# Patient Record
Sex: Female | Born: 1965 | Race: White | Hispanic: No | Marital: Married | Smoking: Never smoker
Health system: Southern US, Community
[De-identification: ages and names within clinical notes are randomized; demographics above are authoritative.]

## PROBLEM LIST (undated history)

## (undated) DIAGNOSIS — G20A1 Parkinson's disease without dyskinesia, without mention of fluctuations: Secondary | ICD-10-CM

## (undated) DIAGNOSIS — L409 Psoriasis, unspecified: Secondary | ICD-10-CM

## (undated) DIAGNOSIS — I10 Essential (primary) hypertension: Secondary | ICD-10-CM

## (undated) DIAGNOSIS — G20 Parkinson's disease: Secondary | ICD-10-CM

## (undated) HISTORY — PX: INNER EAR SURGERY: SHX679

## (undated) HISTORY — DX: Essential (primary) hypertension: I10

## (undated) HISTORY — DX: Psoriasis, unspecified: L40.9

## (undated) HISTORY — DX: Parkinson's disease without dyskinesia, without mention of fluctuations: G20.A1

## (undated) HISTORY — DX: Parkinson's disease: G20

---

## 1998-07-26 ENCOUNTER — Other Ambulatory Visit: Admission: RE | Admit: 1998-07-26 | Discharge: 1998-07-26 | Payer: Self-pay | Admitting: Obstetrics and Gynecology

## 1999-07-27 ENCOUNTER — Other Ambulatory Visit: Admission: RE | Admit: 1999-07-27 | Discharge: 1999-07-27 | Payer: Self-pay | Admitting: Obstetrics and Gynecology

## 1999-09-04 ENCOUNTER — Other Ambulatory Visit: Admission: RE | Admit: 1999-09-04 | Discharge: 1999-09-04 | Payer: Self-pay | Admitting: Obstetrics and Gynecology

## 1999-12-11 ENCOUNTER — Other Ambulatory Visit: Admission: RE | Admit: 1999-12-11 | Discharge: 1999-12-11 | Payer: Self-pay | Admitting: Obstetrics and Gynecology

## 2000-06-28 ENCOUNTER — Inpatient Hospital Stay (HOSPITAL_COMMUNITY): Admission: AD | Admit: 2000-06-28 | Discharge: 2000-07-01 | Payer: Self-pay | Admitting: Obstetrics and Gynecology

## 2000-08-08 ENCOUNTER — Other Ambulatory Visit: Admission: RE | Admit: 2000-08-08 | Discharge: 2000-08-08 | Payer: Self-pay | Admitting: Obstetrics and Gynecology

## 2001-08-25 ENCOUNTER — Other Ambulatory Visit: Admission: RE | Admit: 2001-08-25 | Discharge: 2001-08-25 | Payer: Self-pay | Admitting: Obstetrics and Gynecology

## 2002-08-21 ENCOUNTER — Other Ambulatory Visit: Admission: RE | Admit: 2002-08-21 | Discharge: 2002-08-21 | Payer: Self-pay | Admitting: Obstetrics and Gynecology

## 2003-10-11 ENCOUNTER — Other Ambulatory Visit: Admission: RE | Admit: 2003-10-11 | Discharge: 2003-10-11 | Payer: Self-pay | Admitting: Obstetrics and Gynecology

## 2004-11-10 ENCOUNTER — Other Ambulatory Visit: Admission: RE | Admit: 2004-11-10 | Discharge: 2004-11-10 | Payer: Self-pay | Admitting: Obstetrics and Gynecology

## 2005-06-07 ENCOUNTER — Ambulatory Visit: Payer: Self-pay | Admitting: General Practice

## 2005-06-22 ENCOUNTER — Ambulatory Visit: Payer: Self-pay | Admitting: General Practice

## 2005-11-29 ENCOUNTER — Other Ambulatory Visit: Admission: RE | Admit: 2005-11-29 | Discharge: 2005-11-29 | Payer: Self-pay | Admitting: Obstetrics and Gynecology

## 2006-07-02 ENCOUNTER — Ambulatory Visit: Payer: Self-pay

## 2007-07-08 ENCOUNTER — Ambulatory Visit: Payer: Self-pay

## 2008-07-08 ENCOUNTER — Ambulatory Visit: Payer: Self-pay

## 2009-07-11 ENCOUNTER — Ambulatory Visit: Payer: Self-pay

## 2010-05-30 ENCOUNTER — Ambulatory Visit: Payer: Self-pay

## 2011-05-06 ENCOUNTER — Emergency Department: Payer: Self-pay | Admitting: Emergency Medicine

## 2011-05-31 ENCOUNTER — Ambulatory Visit: Payer: Self-pay

## 2012-04-08 ENCOUNTER — Ambulatory Visit: Payer: Self-pay

## 2013-04-14 ENCOUNTER — Ambulatory Visit: Payer: Self-pay

## 2014-04-15 ENCOUNTER — Ambulatory Visit: Payer: Self-pay

## 2015-07-11 ENCOUNTER — Ambulatory Visit: Payer: Self-pay | Admitting: Physician Assistant

## 2015-07-11 DIAGNOSIS — Z299 Encounter for prophylactic measures, unspecified: Secondary | ICD-10-CM

## 2015-07-11 NOTE — Progress Notes (Signed)
Patient ID: Kathy CookeyDarlene S Thompson, female   DOB: May 29, 1966, [age] y.o.   MRN: [id number]   Patient came in to have flu shot done here in the clinic.  Patient stayed for 10 minutes and had no adverse reaction.

## 2015-08-03 ENCOUNTER — Other Ambulatory Visit: Payer: Self-pay | Admitting: Obstetrics and Gynecology

## 2015-08-03 DIAGNOSIS — Z1231 Encounter for screening mammogram for malignant neoplasm of breast: Secondary | ICD-10-CM

## 2015-08-18 ENCOUNTER — Ambulatory Visit
Admission: RE | Admit: 2015-08-18 | Discharge: 2015-08-18 | Disposition: A | Discharge status: Home / Self Care | Payer: PRIVATE HEALTH INSURANCE | Source: Ambulatory Visit | Attending: Obstetrics and Gynecology | Admitting: Obstetrics and Gynecology

## 2015-08-18 DIAGNOSIS — Z1231 Encounter for screening mammogram for malignant neoplasm of breast: Secondary | ICD-10-CM | POA: Diagnosis not present

## 2015-08-23 ENCOUNTER — Other Ambulatory Visit: Payer: Self-pay | Admitting: Obstetrics and Gynecology

## 2015-08-23 DIAGNOSIS — R928 Other abnormal and inconclusive findings on diagnostic imaging of breast: Secondary | ICD-10-CM

## 2015-08-26 ENCOUNTER — Ambulatory Visit
Admission: RE | Admit: 2015-08-26 | Discharge: 2015-08-26 | Disposition: A | Discharge status: Home / Self Care | Payer: Managed Care, Other (non HMO) | Source: Ambulatory Visit | Attending: Obstetrics and Gynecology | Admitting: Obstetrics and Gynecology

## 2015-08-26 DIAGNOSIS — R928 Other abnormal and inconclusive findings on diagnostic imaging of breast: Secondary | ICD-10-CM | POA: Diagnosis present

## 2015-09-02 ENCOUNTER — Ambulatory Visit: Payer: PRIVATE HEALTH INSURANCE

## 2015-09-02 ENCOUNTER — Other Ambulatory Visit: Payer: Self-pay

## 2015-10-20 ENCOUNTER — Other Ambulatory Visit: Payer: Self-pay

## 2015-10-20 DIAGNOSIS — Z299 Encounter for prophylactic measures, unspecified: Secondary | ICD-10-CM

## 2015-10-20 NOTE — Progress Notes (Signed)
Patient came in to have blood drawn per Dr. Roseanne Kaufman at [facility].  Blood was drawn from the right arm without any incident.

## 2015-10-21 LAB — COMPREHENSIVE METABOLIC PANEL
A/G RATIO: 1.7 (ref 1.1–2.5)
ALBUMIN: 4.5 g/dL (ref 3.5–5.5)
ALT: 15 IU/L (ref 0–32)
AST: 12 IU/L (ref 0–40)
Alkaline Phosphatase: 73 IU/L (ref 39–117)
BUN / CREAT RATIO: 18 (ref 9–23)
BUN: 12 mg/dL (ref 6–24)
Bilirubin Total: 0.4 mg/dL (ref 0.0–1.2)
CALCIUM: 9.2 mg/dL (ref 8.7–10.2)
CO2: 25 mmol/L (ref 18–29)
Chloride: 100 mmol/L (ref 96–106)
Creatinine, Ser: 0.67 mg/dL (ref 0.57–1.00)
GFR, EST AFRICAN AMERICAN: 119 mL/min/{1.73_m2} (ref >59)
GFR, EST NON AFRICAN AMERICAN: 103 mL/min/{1.73_m2} (ref >59)
GLOBULIN, TOTAL: 2.7 g/dL (ref 1.5–4.5)
Glucose: 70 mg/dL (ref 65–99)
POTASSIUM: 4.1 mmol/L (ref 3.5–5.2)
SODIUM: 138 mmol/L (ref 134–144)
TOTAL PROTEIN: 7.2 g/dL (ref 6.0–8.5)

## 2015-10-21 LAB — CBC WITH DIFFERENTIAL/PLATELET
BASOS: 1 %
Basophils Absolute: 0.1 10*3/uL (ref 0.0–0.2)
EOS (ABSOLUTE): 0.3 10*3/uL (ref 0.0–0.4)
Eos: 4 %
HEMATOCRIT: 42.3 % (ref 34.0–46.6)
Hemoglobin: 14.5 g/dL (ref 11.1–15.9)
Immature Grans (Abs): 0 10*3/uL (ref 0.0–0.1)
Immature Granulocytes: 0 %
LYMPHS ABS: 2.6 10*3/uL (ref 0.7–3.1)
Lymphs: 41 %
MCH: 31.5 pg (ref 26.6–33.0)
MCHC: 34.3 g/dL (ref 31.5–35.7)
MCV: 92 fL (ref 79–97)
MONOS ABS: 0.4 10*3/uL (ref 0.1–0.9)
Monocytes: 7 %
NEUTROS ABS: 3 10*3/uL (ref 1.4–7.0)
Neutrophils: 47 %
PLATELETS: 310 10*3/uL (ref 150–379)
RBC: 4.61 x10E6/uL (ref 3.77–5.28)
RDW: 13.8 % (ref 12.3–15.4)
WBC: 6.4 10*3/uL (ref 3.4–10.8)

## 2015-10-21 NOTE — Progress Notes (Signed)
Lab results were faxed to Dr. Park MeoIsenstien at [facility] per patient's request.

## 2015-10-28 LAB — HM COLONOSCOPY

## 2015-11-30 ENCOUNTER — Other Ambulatory Visit: Payer: Self-pay

## 2015-11-30 DIAGNOSIS — Z299 Encounter for prophylactic measures, unspecified: Secondary | ICD-10-CM

## 2015-11-30 NOTE — Progress Notes (Signed)
Patient came in to have blood drawn [facility].  Blood was drawn from the right arm without any incident.  Patient wants results sent to Dr. Roseanne KaufmanIsenstein when they are finalized.

## 2015-12-01 LAB — AST: AST: 13 IU/L (ref 0–40)

## 2015-12-01 LAB — CBC WITH DIFFERENTIAL/PLATELET
BASOS ABS: 0.1 10*3/uL (ref 0.0–0.2)
Basos: 1 %
EOS (ABSOLUTE): 0.3 10*3/uL (ref 0.0–0.4)
Eos: 4 %
HEMOGLOBIN: 13.6 g/dL (ref 11.1–15.9)
Hematocrit: 40 % (ref 34.0–46.6)
IMMATURE GRANS (ABS): 0 10*3/uL (ref 0.0–0.1)
IMMATURE GRANULOCYTES: 0 %
LYMPHS: 35 %
Lymphocytes Absolute: 2.1 10*3/uL (ref 0.7–3.1)
MCH: 31.3 pg (ref 26.6–33.0)
MCHC: 34 g/dL (ref 31.5–35.7)
MCV: 92 fL (ref 79–97)
MONOCYTES: 8 %
Monocytes Absolute: 0.5 10*3/uL (ref 0.1–0.9)
NEUTROS PCT: 52 %
Neutrophils Absolute: 3.1 10*3/uL (ref 1.4–7.0)
PLATELETS: 285 10*3/uL (ref 150–379)
RBC: 4.35 x10E6/uL (ref 3.77–5.28)
RDW: 13.9 % (ref 12.3–15.4)
WBC: 5.9 10*3/uL (ref 3.4–10.8)

## 2015-12-01 LAB — ALT: ALT: 7 IU/L (ref 0–32)

## 2016-04-20 ENCOUNTER — Other Ambulatory Visit: Payer: Self-pay

## 2016-04-20 DIAGNOSIS — Z299 Encounter for prophylactic measures, unspecified: Secondary | ICD-10-CM

## 2016-04-20 NOTE — Progress Notes (Signed)
Patient came in to have blood drawn per Dr. Durene Calasher's orders.

## 2016-04-21 LAB — HEPATITIS B CORE ANTIBODY, TOTAL: HEP B C TOTAL AB: NEGATIVE

## 2016-04-21 LAB — HEPATITIS B SURFACE ANTIGEN: Hepatitis B Surface Ag: NEGATIVE

## 2016-04-21 LAB — HEPATITIS B SURFACE ANTIBODY, QUANTITATIVE: Hepatitis B Surf Ab Quant: <3.1 m[IU]/mL — ABNORMAL LOW (ref >9.9)

## 2016-04-21 LAB — HEPATITIS C ANTIBODY: Hep C Virus Ab: 0.3 s/co ratio (ref 0.0–0.9)

## 2016-04-25 LAB — QUANTIFERON IN TUBE
QUANTIFERON MITOGEN VALUE: >10 IU/mL
QUANTIFERON TB AG VALUE: 0.01 [IU]/mL
QUANTIFERON TB GOLD: NEGATIVE
Quantiferon Nil Value: 0.02 IU/mL

## 2016-04-25 LAB — QUANTIFERON TB GOLD ASSAY (BLOOD)

## 2016-05-24 ENCOUNTER — Other Ambulatory Visit: Payer: Self-pay

## 2016-05-24 DIAGNOSIS — Z299 Encounter for prophylactic measures, unspecified: Secondary | ICD-10-CM

## 2016-05-24 NOTE — Progress Notes (Signed)
Patient came in to have blood drawn for testing per Dr. Octavio Gravesavid Lowe's orders.

## 2016-05-25 LAB — FOLLICLE STIMULATING HORMONE: FSH: 26 m[IU]/mL

## 2016-11-27 ENCOUNTER — Other Ambulatory Visit: Payer: Self-pay

## 2016-11-27 NOTE — Progress Notes (Signed)
Patient came in to have her biometric screening done.

## 2016-11-28 ENCOUNTER — Other Ambulatory Visit: Payer: Self-pay

## 2016-11-28 DIAGNOSIS — Z299 Encounter for prophylactic measures, unspecified: Secondary | ICD-10-CM

## 2016-11-28 NOTE — Progress Notes (Signed)
Patient came in to have blood drawn for testing per Susan's authorization. 

## 2016-11-29 LAB — CMP12+LP+TP+TSH+6AC+CBC/D/PLT
A/G RATIO: 2 (ref 1.2–2.2)
ALBUMIN: 4.5 g/dL (ref 3.5–5.5)
ALK PHOS: 59 IU/L (ref 39–117)
ALT: 9 IU/L (ref 0–32)
AST: 14 IU/L (ref 0–40)
BASOS: 0 %
BILIRUBIN TOTAL: 0.4 mg/dL (ref 0.0–1.2)
BUN / CREAT RATIO: 18 (ref 9–23)
BUN: 12 mg/dL (ref 6–24)
Basophils Absolute: 0 10*3/uL (ref 0.0–0.2)
CALCIUM: 9 mg/dL (ref 8.7–10.2)
CHOL/HDL RATIO: 3.1 ratio (ref 0.0–4.4)
Chloride: 100 mmol/L (ref 96–106)
Cholesterol, Total: 182 mg/dL (ref 100–199)
Creatinine, Ser: 0.65 mg/dL (ref 0.57–1.00)
EOS (ABSOLUTE): 0.2 10*3/uL (ref 0.0–0.4)
EOS: 3 %
Estimated CHD Risk: <0.5 times avg. (ref 0.0–1.0)
FREE THYROXINE INDEX: 2.1 (ref 1.2–4.9)
GFR calc non Af Amer: 103 mL/min/{1.73_m2} (ref >59)
GFR, EST AFRICAN AMERICAN: 119 mL/min/{1.73_m2} (ref >59)
GGT: 10 IU/L (ref 0–60)
GLOBULIN, TOTAL: 2.3 g/dL (ref 1.5–4.5)
Glucose: 85 mg/dL (ref 65–99)
HDL: 59 mg/dL (ref >39)
HEMATOCRIT: 44.8 % (ref 34.0–46.6)
Hemoglobin: 15 g/dL (ref 11.1–15.9)
IMMATURE GRANS (ABS): 0 10*3/uL (ref 0.0–0.1)
Immature Granulocytes: 0 %
Iron: 128 ug/dL (ref 27–159)
LDH: 165 IU/L (ref 119–226)
LDL CALC: 111 mg/dL — AB (ref 0–99)
LYMPHS: 24 %
Lymphocytes Absolute: 1.9 10*3/uL (ref 0.7–3.1)
MCH: 31.5 pg (ref 26.6–33.0)
MCHC: 33.5 g/dL (ref 31.5–35.7)
MCV: 94 fL (ref 79–97)
MONOCYTES: 8 %
MONOS ABS: 0.6 10*3/uL (ref 0.1–0.9)
NEUTROS ABS: 5.3 10*3/uL (ref 1.4–7.0)
NEUTROS PCT: 65 %
POTASSIUM: 4.5 mmol/L (ref 3.5–5.2)
Phosphorus: 3.2 mg/dL (ref 2.5–4.5)
Platelets: 279 10*3/uL (ref 150–379)
RBC: 4.76 x10E6/uL (ref 3.77–5.28)
RDW: 13.2 % (ref 12.3–15.4)
SODIUM: 139 mmol/L (ref 134–144)
T3 Uptake Ratio: 24 % (ref 24–39)
T4, Total: 8.8 ug/dL (ref 4.5–12.0)
TRIGLYCERIDES: 60 mg/dL (ref 0–149)
TSH: 1.31 u[IU]/mL (ref 0.450–4.500)
Total Protein: 6.8 g/dL (ref 6.0–8.5)
Uric Acid: 3.2 mg/dL (ref 2.5–7.1)
VLDL CHOLESTEROL CAL: 12 mg/dL (ref 5–40)
WBC: 8.2 10*3/uL (ref 3.4–10.8)

## 2016-11-29 LAB — HEPATITIS C ANTIBODY (REFLEX): HCV AB: 0.5 {s_co_ratio} (ref 0.0–0.9)

## 2016-11-29 LAB — VITAMIN D 25 HYDROXY (VIT D DEFICIENCY, FRACTURES): VIT D 25 HYDROXY: 19.2 ng/mL — AB (ref 30.0–100.0)

## 2016-11-29 LAB — HCV COMMENT:

## 2016-11-29 LAB — HIV ANTIBODY (ROUTINE TESTING W REFLEX): HIV Screen 4th Generation wRfx: NONREACTIVE

## 2017-04-05 IMAGING — MG MM DIGITAL SCREENING BILATERAL
4 series · 4 of 4 positions shown · non-contrast
Comparison: Previous exam(s).

CLINICAL DATA: Screening.

EXAM:
DIGITAL SCREENING BILATERAL MAMMOGRAM WITH CAD

[R MLO]
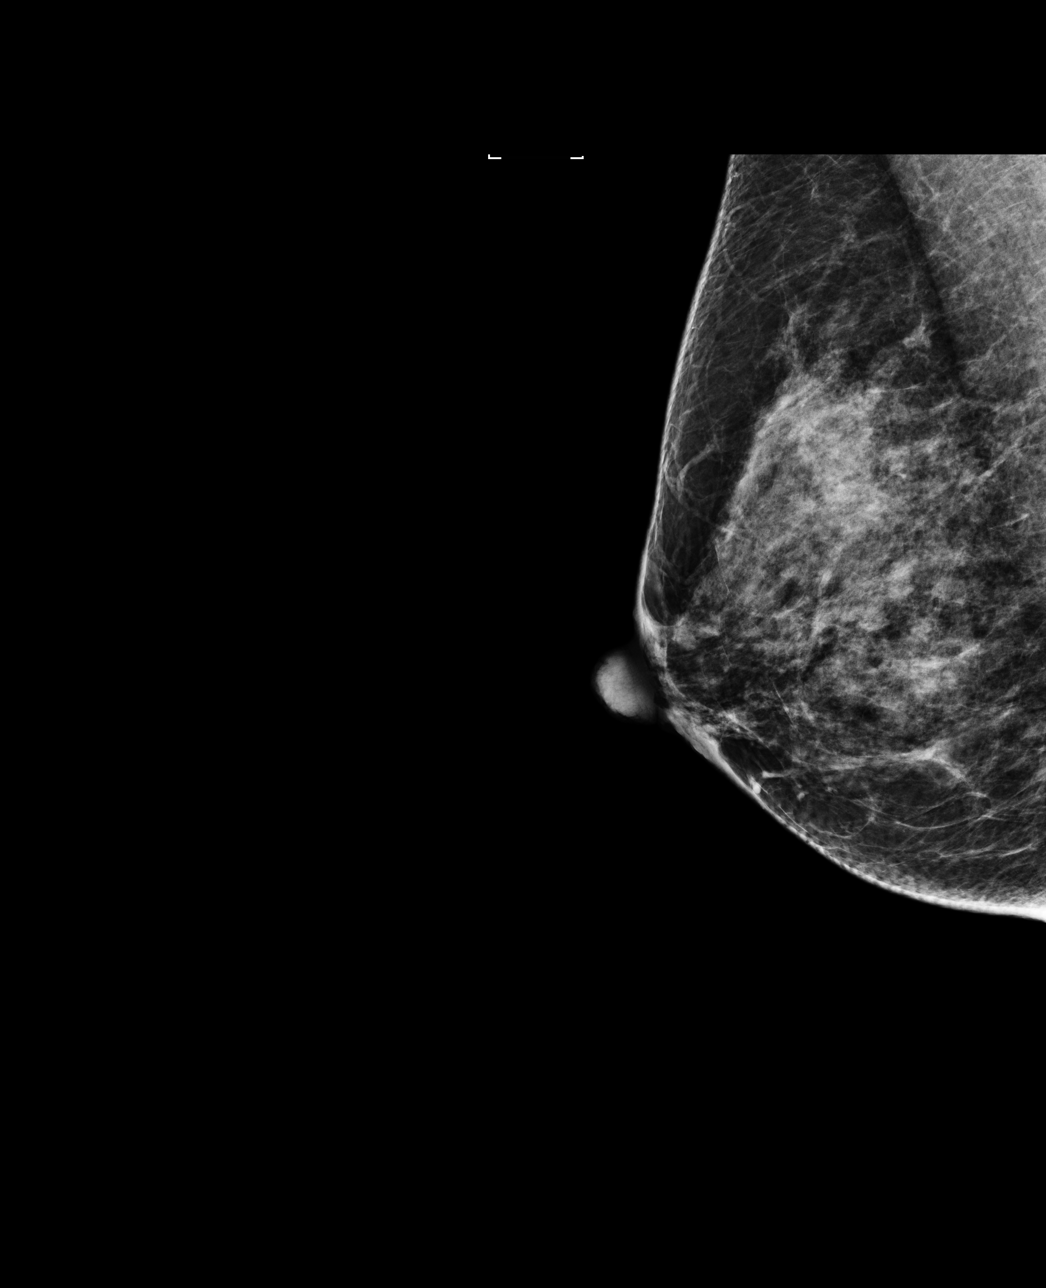

[L MLO]
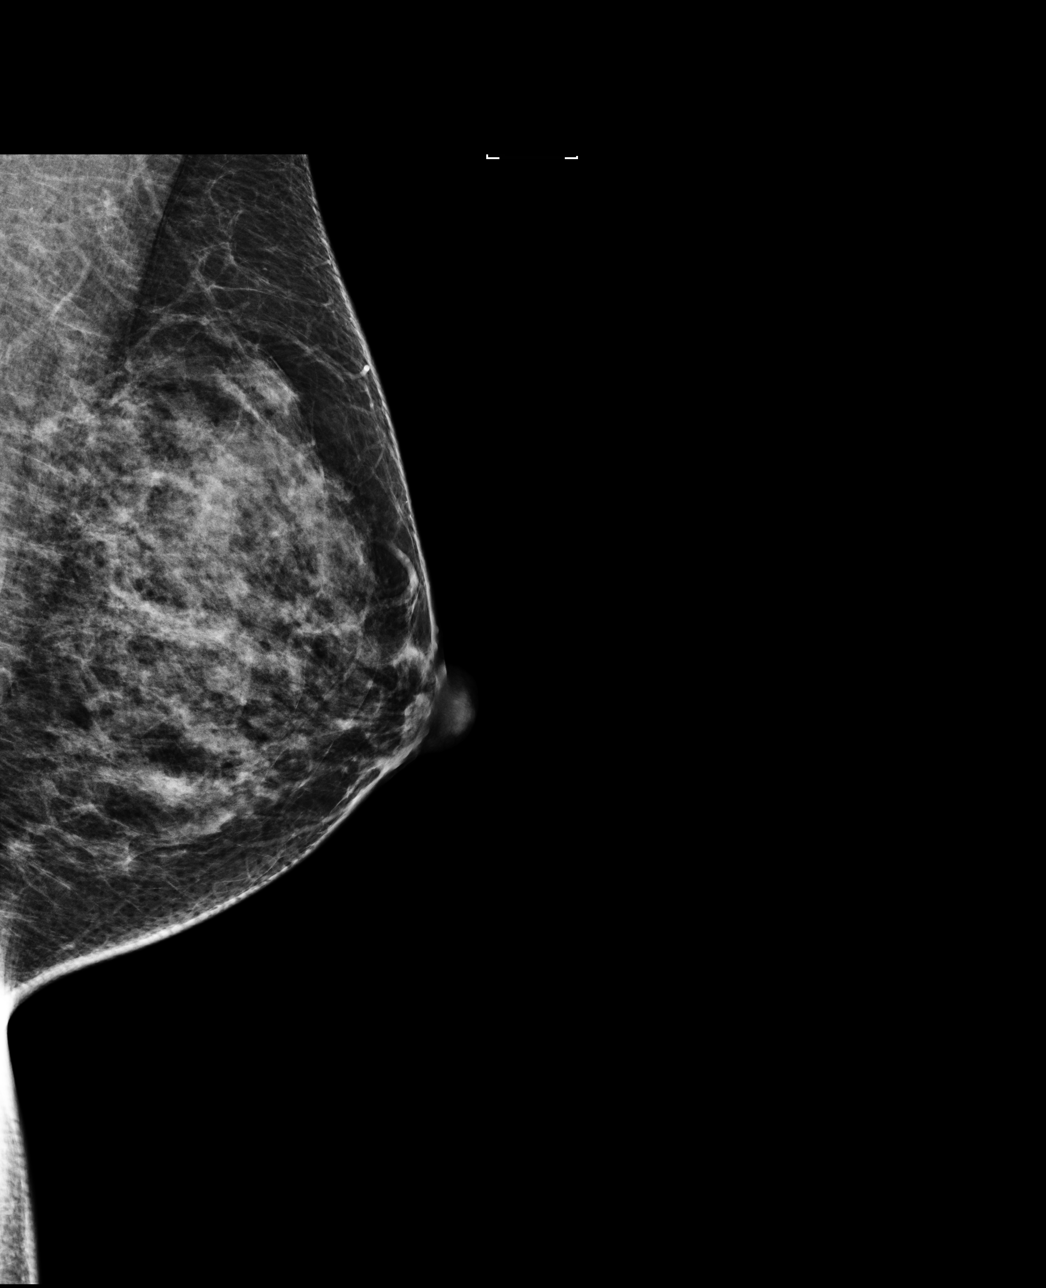

[R CC]
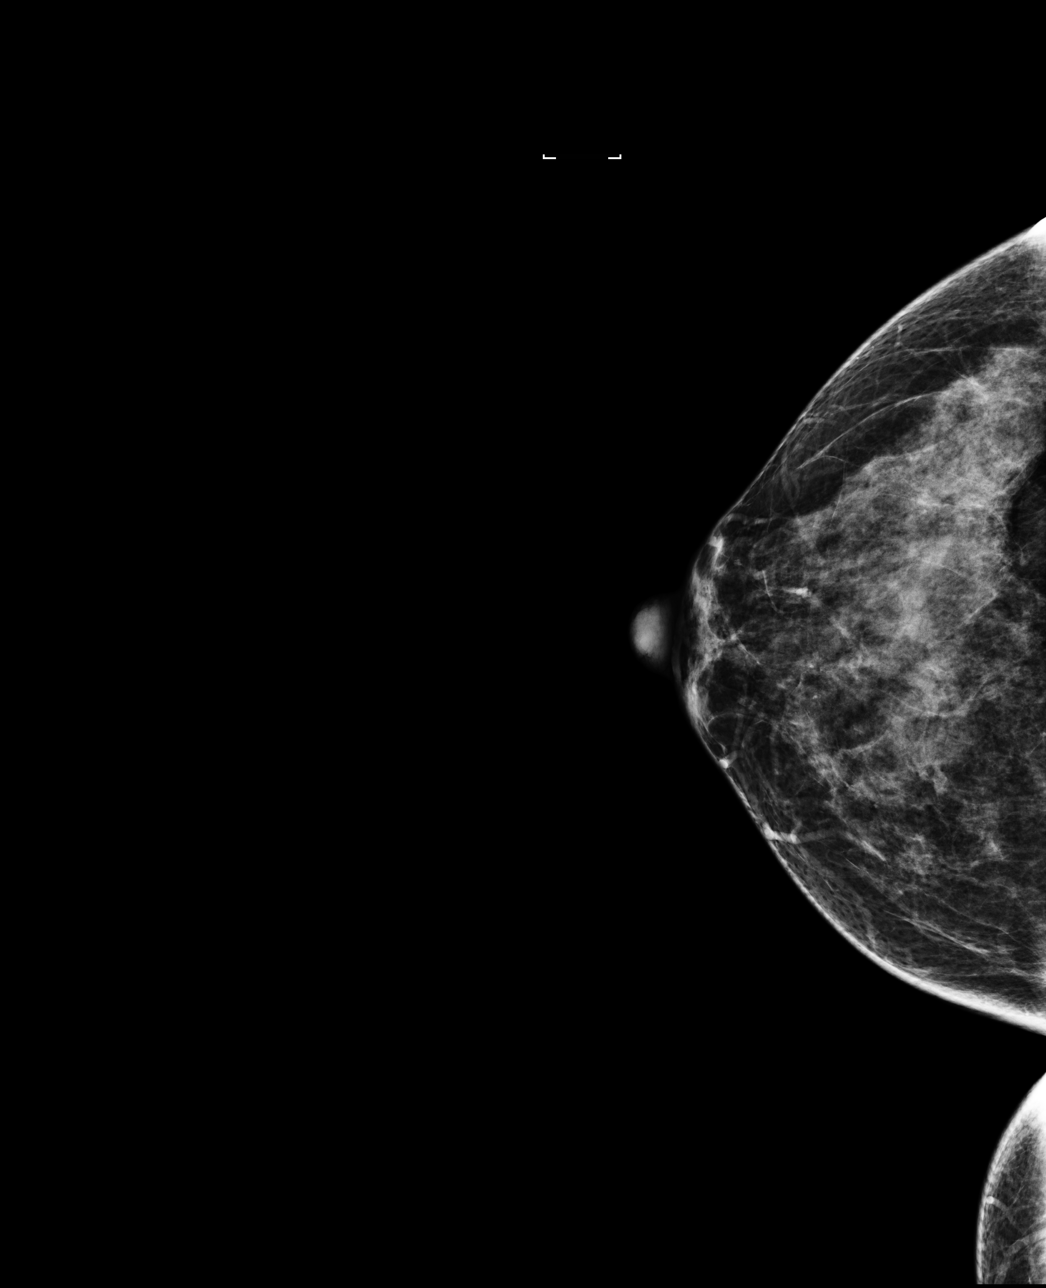

[L CC]
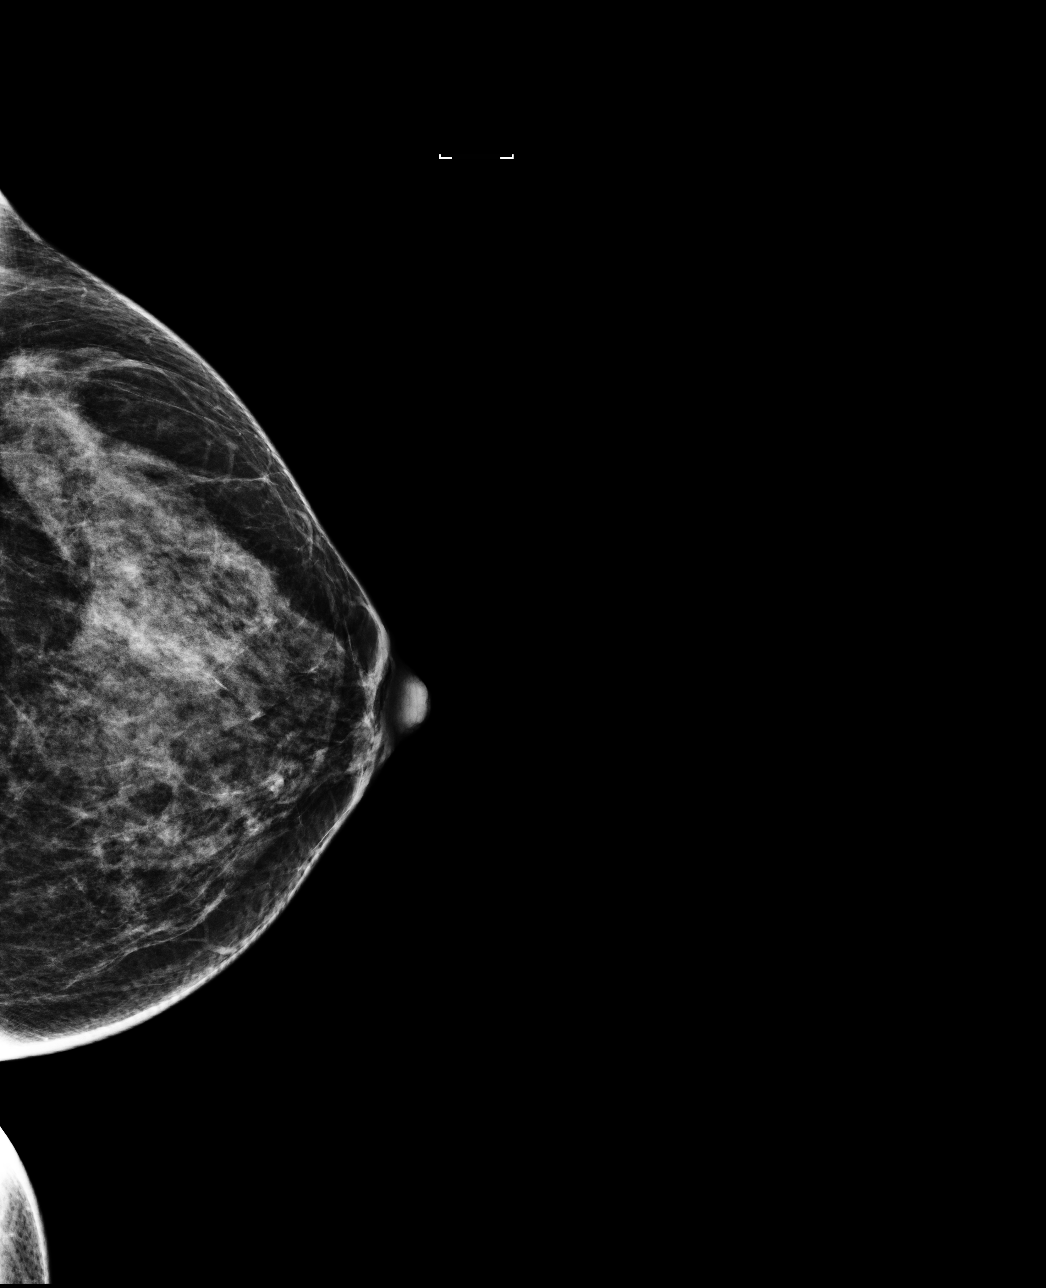

[4 of 4 positions shown; findings below may reference images not displayed]

ACR Breast Density Category c: The breast tissue is heterogeneously
dense, which may obscure small masses.
FINDINGS: In the right breast, a possible mass warrants further evaluation. In
the left breast, no findings suspicious for malignancy. Images were
processed with CAD.
IMPRESSION: Further evaluation is suggested for possible mass in the right
breast.

RECOMMENDATION:
Diagnostic mammogram and possibly ultrasound of the right breast.
(Code:[id number])

The patient will be contacted regarding the findings, and additional
imaging will be scheduled.

BI-RADS CATEGORY  0: Incomplete. Need additional imaging evaluation
and/or prior mammograms for comparison.

## 2017-09-04 ENCOUNTER — Ambulatory Visit: Payer: Self-pay

## 2017-09-06 ENCOUNTER — Other Ambulatory Visit: Payer: Self-pay | Admitting: Medical

## 2017-09-06 DIAGNOSIS — Z008 Encounter for other general examination: Secondary | ICD-10-CM

## 2017-09-06 DIAGNOSIS — Z0189 Encounter for other specified special examinations: Principal | ICD-10-CM

## 2017-09-06 NOTE — Progress Notes (Signed)
Patient came in to have blood drawn for testing per Dr. Roseanne KaufmanIsenstein and Dr. Vance GatherLowe's orders.  She also had her biometric wellness screening.

## 2017-09-07 LAB — CMP12+LP+TP+TSH+6AC+CBC/D/PLT
ALBUMIN: 4.3 g/dL (ref 3.5–5.5)
ALK PHOS: 58 IU/L (ref 39–117)
ALT: 10 IU/L (ref 0–32)
AST: 14 IU/L (ref 0–40)
Albumin/Globulin Ratio: 1.7 (ref 1.2–2.2)
BILIRUBIN TOTAL: 0.4 mg/dL (ref 0.0–1.2)
BUN/Creatinine Ratio: 16 (ref 9–23)
BUN: 11 mg/dL (ref 6–24)
Basophils Absolute: 0.1 10*3/uL (ref 0.0–0.2)
Basos: 1 %
CHLORIDE: 103 mmol/L (ref 96–106)
CHOLESTEROL TOTAL: 177 mg/dL (ref 100–199)
Calcium: 9.1 mg/dL (ref 8.7–10.2)
Chol/HDL Ratio: 2.9 ratio (ref 0.0–4.4)
Creatinine, Ser: 0.67 mg/dL (ref 0.57–1.00)
EOS (ABSOLUTE): 0.2 10*3/uL (ref 0.0–0.4)
Eos: 3 %
FREE THYROXINE INDEX: 1.9 (ref 1.2–4.9)
GFR calc Af Amer: 117 mL/min/{1.73_m2} (ref >59)
GFR calc non Af Amer: 101 mL/min/{1.73_m2} (ref >59)
GGT: 9 IU/L (ref 0–60)
GLOBULIN, TOTAL: 2.6 g/dL (ref 1.5–4.5)
Glucose: 79 mg/dL (ref 65–99)
HDL: 62 mg/dL (ref >39)
HEMOGLOBIN: 14.9 g/dL (ref 11.1–15.9)
Hematocrit: 43.6 % (ref 34.0–46.6)
IMMATURE GRANULOCYTES: 0 %
Immature Grans (Abs): 0 10*3/uL (ref 0.0–0.1)
Iron: 107 ug/dL (ref 27–159)
LDH: 162 IU/L (ref 119–226)
LDL Calculated: 105 mg/dL — ABNORMAL HIGH (ref 0–99)
LYMPHS ABS: 2.6 10*3/uL (ref 0.7–3.1)
Lymphs: 32 %
MCH: 32.3 pg (ref 26.6–33.0)
MCHC: 34.2 g/dL (ref 31.5–35.7)
MCV: 95 fL (ref 79–97)
MONOS ABS: 0.5 10*3/uL (ref 0.1–0.9)
Monocytes: 5 %
NEUTROS PCT: 59 %
Neutrophils Absolute: 4.9 10*3/uL (ref 1.4–7.0)
Phosphorus: 3.3 mg/dL (ref 2.5–4.5)
Platelets: 266 10*3/uL (ref 150–379)
Potassium: 4.3 mmol/L (ref 3.5–5.2)
RBC: 4.61 x10E6/uL (ref 3.77–5.28)
RDW: 13.4 % (ref 12.3–15.4)
Sodium: 139 mmol/L (ref 134–144)
T3 Uptake Ratio: 23 % — ABNORMAL LOW (ref 24–39)
T4, Total: 8.3 ug/dL (ref 4.5–12.0)
TOTAL PROTEIN: 6.9 g/dL (ref 6.0–8.5)
TRIGLYCERIDES: 51 mg/dL (ref 0–149)
TSH: 1.28 u[IU]/mL (ref 0.450–4.500)
Uric Acid: 3.1 mg/dL (ref 2.5–7.1)
VLDL CHOLESTEROL CAL: 10 mg/dL (ref 5–40)
WBC: 8.3 10*3/uL (ref 3.4–10.8)

## 2017-09-07 LAB — VITAMIN D 25 HYDROXY (VIT D DEFICIENCY, FRACTURES): Vit D, 25-Hydroxy: 17.9 ng/mL — ABNORMAL LOW (ref 30.0–100.0)

## 2017-09-07 LAB — FOLLICLE STIMULATING HORMONE: FSH: 4.4 m[IU]/mL

## 2017-09-10 LAB — QUANTIFERON-TB GOLD PLUS
QUANTIFERON NIL VALUE: 0.04 [IU]/mL
QUANTIFERON TB2 AG VALUE: 0.04 [IU]/mL
QUANTIFERON-TB GOLD PLUS: NEGATIVE
QuantiFERON TB1 Ag Value: 0.03 IU/mL

## 2018-08-24 ENCOUNTER — Encounter: Payer: Self-pay | Admitting: Nurse Practitioner

## 2018-08-24 DIAGNOSIS — Z87898 Personal history of other specified conditions: Secondary | ICD-10-CM | POA: Insufficient documentation

## 2018-08-25 ENCOUNTER — Encounter: Payer: Self-pay | Admitting: Nurse Practitioner

## 2018-08-25 ENCOUNTER — Ambulatory Visit (INDEPENDENT_AMBULATORY_CARE_PROVIDER_SITE_OTHER): Payer: Managed Care, Other (non HMO) | Admitting: Nurse Practitioner

## 2018-08-25 ENCOUNTER — Other Ambulatory Visit: Payer: Self-pay

## 2018-08-25 VITALS — BP 138/90 | HR 87 | Temp 98.5°F | Ht 66.0 in | Wt 151.0 lb

## 2018-08-25 DIAGNOSIS — R030 Elevated blood-pressure reading, without diagnosis of hypertension: Secondary | ICD-10-CM | POA: Diagnosis not present

## 2018-08-25 DIAGNOSIS — L409 Psoriasis, unspecified: Secondary | ICD-10-CM

## 2018-08-25 DIAGNOSIS — I10 Essential (primary) hypertension: Secondary | ICD-10-CM | POA: Insufficient documentation

## 2018-08-25 NOTE — Assessment & Plan Note (Addendum)
Continue to collaborate with Dr. Isa Rankin.  Continue Humira.

## 2018-08-25 NOTE — Progress Notes (Signed)
New Patient Office Visit  Subjective:  Patient ID: Kathy Thompson, female    DOB: [birth year]  Age: 53 y.o. MRN: [id number]  CC:  Chief Complaint  Patient presents with  . Establish Care    pt would like to discuss her BP    HPI Kathy CookeyDarlene S Thompson presents for new patient visit to establish care.  Introduced to Publishing rights managernurse practitioner role and practice setting.  All questions answered.  On review of overall history in chart, patient has been relatively healthy with exception of x 1 abnormal mammogram in 2016 with normal repeat screening (initially noted possible mass in right breast).  She is due for her next mammogram in March, Dr. Rana SnareLowe orders this for her.    ELEVATED BP: She recently saw Dr. Candice Campavid Lowe for her annual pap smear, at appointment her BP was elevated (on initial and recheck).  She states the doctor told her he was not sure if it was related to long term BCP use, currently continues on this but states the doctor is "supposed to send in lower dose medication for me".  She believes her sister is on BP medication.  Has been doing BP at home 140's/90's.   Has not been exercising as much since the gym across the street from her workplace closed, encouraged regular exercise regimen.  States she drinks a few diet mountain dew a day, had at length discussion about diet regimen with patient.    PSORIASIS: Is followed by Dr. Isa RankinEisenstein and continues on Humira.  She visits with provider twice a year, every six months.  Denies any issues with current medication regimen.  History reviewed. No pertinent past medical history.  Past Surgical History:  Procedure Laterality Date  . CESAREAN SECTION    . INNER EAR SURGERY      Family History  Problem Relation Age of Onset  . Breast cancer Cousin        2 first maternal cousins, 9045 and 950's  . Cancer Father   . Hypertension Sister     Social History   Socioeconomic History  . Marital status: Married    Spouse name: Not on file  .  Number of children: Not on file  . Years of education: Not on file  . Highest education level: Not on file  Occupational History  . Occupation: [employer]    Comment: 7 years  Social Needs  . Financial resource strain: Not hard at all  . Food insecurity:    Worry: Never true    Inability: Never true  . Transportation needs:    Medical: No    Non-medical: No  Tobacco Use  . Smoking status: Never Smoker  . Smokeless tobacco: Never Used  Substance and Sexual Activity  . Alcohol use: Never    Frequency: Never  . Drug use: Never  . Sexual activity: Yes  Lifestyle  . Physical activity:    Days per week: 0 days    Minutes per session: 0 min  . Stress: Only a little  Relationships  . Social connections:    Talks on phone: More than three times a week    Gets together: More than three times a week    Attends religious service: More than 4 times per year    Active member of club or organization: No    Attends meetings of clubs or organizations: Never    Relationship status: Married  . Intimate partner violence:    Fear of current  or ex partner: No    Emotionally abused: No    Physically abused: No    Forced sexual activity: No  Other Topics Concern  . Not on file  Social History Narrative  . Not on file    ROS Review of Systems  Constitutional: Negative for activity change, appetite change, diaphoresis, fatigue and fever.  Respiratory: Negative for cough, chest tightness, shortness of breath and wheezing.   Cardiovascular: Negative for chest pain, palpitations and leg swelling.  Gastrointestinal: Negative for abdominal distention, abdominal pain, constipation, diarrhea, nausea and vomiting.  Endocrine: Negative for cold intolerance, heat intolerance, polydipsia, polyphagia and polyuria.  Musculoskeletal: Negative.   Neurological: Negative for dizziness, syncope, weakness, light-headedness, numbness and headaches.  Psychiatric/Behavioral: Negative.      Objective:   Today's Vitals: BP 138/90 (BP Location: Left Arm)   Pulse 87   Temp 98.5 F (36.9 C) (Oral)   Ht 5\' 6"  (1.676 m)   Wt 151 lb (68.5 kg)   SpO2 100%   BMI 24.37 kg/m   Physical Exam Vitals signs and nursing note reviewed.  Constitutional:      General: She is awake.     Appearance: She is well-developed.  HENT:     Head: Normocephalic.     Right Ear: Hearing normal.     Left Ear: Hearing normal.     Nose: Nose normal.     Mouth/Throat:     Mouth: Mucous membranes are moist.  Eyes:     General: Lids are normal.        Right eye: No discharge.        Left eye: No discharge.     Conjunctiva/sclera: Conjunctivae normal.     Pupils: Pupils are equal, round, and reactive to light.  Neck:     Musculoskeletal: Normal range of motion and neck supple.     Thyroid: No thyromegaly.     Vascular: No carotid bruit or JVD.  Cardiovascular:     Rate and Rhythm: Normal rate and regular rhythm.     Heart sounds: Normal heart sounds.  Pulmonary:     Effort: Pulmonary effort is normal.     Breath sounds: Normal breath sounds.  Abdominal:     General: Bowel sounds are normal.     Palpations: Abdomen is soft. There is no hepatomegaly or splenomegaly.  Lymphadenopathy:     Cervical: No cervical adenopathy.  Skin:    General: Skin is warm and dry.  Neurological:     Mental Status: She is alert and oriented to person, place, and time.  Psychiatric:        Attention and Perception: Attention normal.        Mood and Affect: Mood normal.        Behavior: Behavior normal. Behavior is cooperative.        Thought Content: Thought content normal.        Judgment: Judgment normal.     Assessment & Plan:   Problem List Items Addressed This Visit      Musculoskeletal and Integument   Psoriasis    Continue to collaborate with Dr. Isa Rankin.  Continue Humira.        Other   Elevated BP without diagnosis of hypertension    Recommended checking BP at home 3 times a  week and document.  Recommended DASH diet and regular exercise over the next 4 weeks.  If continued elevations at next visit will initiate medication at low dose. Labs next visit.  Outpatient Encounter Medications as of 08/25/2018  Medication Sig  . Adalimumab (HUMIRA [place]) Inject into the skin. Every other week  . norgestrel-ethinyl estradiol (LO/OVRAL,CRYSELLE) 0.3-30 MG-MCG tablet Take 1 tablet by mouth daily.   No facility-administered encounter medications on file as of 08/25/2018.     Follow-up: Return in about 4 weeks (around 09/22/2018) for Follow-up.   Marjie SkiffJOLENE T Quadasia Newsham, NP

## 2018-08-25 NOTE — Assessment & Plan Note (Signed)
Recommended checking BP at home 3 times a week and document.  Recommended DASH diet and regular exercise over the next 4 weeks.  If continued elevations at next visit will initiate medication at low dose. Labs next visit.

## 2018-08-25 NOTE — Patient Instructions (Signed)
DASH Eating Plan  DASH stands for "Dietary Approaches to Stop Hypertension." The DASH eating plan is a healthy eating plan that has been shown to reduce high blood pressure (hypertension). It may also reduce your risk for type 2 diabetes, heart disease, and stroke. The DASH eating plan may also help with weight loss.  What are tips for following this plan?    General guidelines   Avoid eating more than 2,300 mg (milligrams) of salt (sodium) a day. If you have hypertension, you may need to reduce your sodium intake to 1,500 mg a day.   Limit alcohol intake to no more than 1 drink a day for nonpregnant women and 2 drinks a day for men. One drink equals 12 oz of beer, 5 oz of wine, or 1 oz of hard liquor.   Work with your health care provider to maintain a healthy body weight or to lose weight. Ask what an ideal weight is for you.   Get at least 30 minutes of exercise that causes your heart to beat faster (aerobic exercise) most days of the week. Activities may include walking, swimming, or biking.   Work with your health care provider or diet and nutrition specialist (dietitian) to adjust your eating plan to your individual calorie needs.  Reading food labels     Check food labels for the amount of sodium per serving. Choose foods with less than 5 percent of the Daily Value of sodium. Generally, foods with less than 300 mg of sodium per serving fit into this eating plan.   To find whole grains, look for the word "whole" as the first word in the ingredient list.  Shopping   Buy products labeled as "low-sodium" or "no salt added."   Buy fresh foods. Avoid canned foods and premade or frozen meals.  Cooking   Avoid adding salt when cooking. Use salt-free seasonings or herbs instead of table salt or sea salt. Check with your health care provider or pharmacist before using salt substitutes.   Do not fry foods. Cook foods using healthy methods such as baking, boiling, grilling, and broiling instead.   Cook with  heart-healthy oils, such as olive, canola, soybean, or sunflower oil.  Meal planning   Eat a balanced diet that includes:  ? 5 or more servings of fruits and vegetables each day. At each meal, try to fill half of your plate with fruits and vegetables.  ? Up to 6-8 servings of whole grains each day.  ? Less than 6 oz of lean meat, poultry, or fish each day. A 3-oz serving of meat is about the same size as a deck of cards. One egg equals 1 oz.  ? 2 servings of low-fat dairy each day.  ? A serving of nuts, seeds, or beans 5 times each week.  ? Heart-healthy fats. Healthy fats called Omega-3 fatty acids are found in foods such as flaxseeds and coldwater fish, like sardines, salmon, and mackerel.   Limit how much you eat of the following:  ? Canned or prepackaged foods.  ? Food that is high in trans fat, such as fried foods.  ? Food that is high in saturated fat, such as fatty meat.  ? Sweets, desserts, sugary drinks, and other foods with added sugar.  ? Full-fat dairy products.   Do not salt foods before eating.   Try to eat at least 2 vegetarian meals each week.   Eat more home-cooked food and less restaurant, buffet, and fast food.     When eating at a restaurant, ask that your food be prepared with less salt or no salt, if possible.  What foods are recommended?  The items listed may not be a complete list. Talk with your dietitian about what dietary choices are best for you.  Grains  Whole-grain or whole-wheat bread. Whole-grain or whole-wheat pasta. Brown rice. Oatmeal. Quinoa. Bulgur. Whole-grain and low-sodium cereals. Pita bread. Low-fat, low-sodium crackers. Whole-wheat flour tortillas.  Vegetables  Fresh or frozen vegetables (raw, steamed, roasted, or grilled). Low-sodium or reduced-sodium tomato and vegetable juice. Low-sodium or reduced-sodium tomato sauce and tomato paste. Low-sodium or reduced-sodium canned vegetables.  Fruits  All fresh, dried, or frozen fruit. Canned fruit in natural juice (without  added sugar).  Meat and other protein foods  Skinless chicken or turkey. Ground chicken or turkey. Pork with fat trimmed off. Fish and seafood. Egg whites. Dried beans, peas, or lentils. Unsalted nuts, nut butters, and seeds. Unsalted canned beans. Lean cuts of beef with fat trimmed off. Low-sodium, lean deli meat.  Dairy  Low-fat (1%) or fat-free (skim) milk. Fat-free, low-fat, or reduced-fat cheeses. Nonfat, low-sodium ricotta or cottage cheese. Low-fat or nonfat yogurt. Low-fat, low-sodium cheese.  Fats and oils  Soft margarine without trans fats. Vegetable oil. Low-fat, reduced-fat, or light mayonnaise and salad dressings (reduced-sodium). Canola, safflower, olive, soybean, and sunflower oils. Avocado.  Seasoning and other foods  Herbs. Spices. Seasoning mixes without salt. Unsalted popcorn and pretzels. Fat-free sweets.  What foods are not recommended?  The items listed may not be a complete list. Talk with your dietitian about what dietary choices are best for you.  Grains  Baked goods made with fat, such as croissants, muffins, or some breads. Dry pasta or rice meal packs.  Vegetables  Creamed or fried vegetables. Vegetables in a cheese sauce. Regular canned vegetables (not low-sodium or reduced-sodium). Regular canned tomato sauce and paste (not low-sodium or reduced-sodium). Regular tomato and vegetable juice (not low-sodium or reduced-sodium). Pickles. Olives.  Fruits  Canned fruit in a light or heavy syrup. Fried fruit. Fruit in cream or butter sauce.  Meat and other protein foods  Fatty cuts of meat. Ribs. Fried meat. Bacon. Sausage. Bologna and other processed lunch meats. Salami. Fatback. Hotdogs. Bratwurst. Salted nuts and seeds. Canned beans with added salt. Canned or smoked fish. Whole eggs or egg yolks. Chicken or turkey with skin.  Dairy  Whole or 2% milk, cream, and half-and-half. Whole or full-fat cream cheese. Whole-fat or sweetened yogurt. Full-fat cheese. Nondairy creamers. Whipped toppings.  Processed cheese and cheese spreads.  Fats and oils  Butter. Stick margarine. Lard. Shortening. Ghee. Bacon fat. Tropical oils, such as coconut, palm kernel, or palm oil.  Seasoning and other foods  Salted popcorn and pretzels. Onion salt, garlic salt, seasoned salt, table salt, and sea salt. Worcestershire sauce. Tartar sauce. Barbecue sauce. Teriyaki sauce. Soy sauce, including reduced-sodium. Steak sauce. Canned and packaged gravies. Fish sauce. Oyster sauce. Cocktail sauce. Horseradish that you find on the shelf. Ketchup. Mustard. Meat flavorings and tenderizers. Bouillon cubes. Hot sauce and Tabasco sauce. Premade or packaged marinades. Premade or packaged taco seasonings. Relishes. Regular salad dressings.  Where to find more information:   National Heart, Lung, and Blood Institute: www.nhlbi.nih.gov   American Heart Association: www.heart.org  Summary   The DASH eating plan is a healthy eating plan that has been shown to reduce high blood pressure (hypertension). It may also reduce your risk for type 2 diabetes, heart disease, and stroke.   With the   DASH eating plan, you should limit salt (sodium) intake to 2,300 mg a day. If you have hypertension, you may need to reduce your sodium intake to 1,500 mg a day.   When on the DASH eating plan, aim to eat more fresh fruits and vegetables, whole grains, lean proteins, low-fat dairy, and heart-healthy fats.   Work with your health care provider or diet and nutrition specialist (dietitian) to adjust your eating plan to your individual calorie needs.  This information is not intended to replace advice given to you by your health care provider. Make sure you discuss any questions you have with your health care provider.  Document Released: 07/26/2011 Document Revised: 07/30/2016 Document Reviewed: 07/30/2016  Elsevier Interactive Patient Education  2019 [employer]

## 2018-09-02 ENCOUNTER — Ambulatory Visit: Payer: Self-pay | Admitting: *Deleted

## 2018-09-02 NOTE — Telephone Encounter (Signed)
Pt reports seen by J. Cannady 08/25/2018. Has been monitoring  BP per instructions. Reports since last week BP has been ranging 140's-150's/ low 90's, "At times higher 90's to 101." States yesterday 160/101, this AM 154/98. Pt reports intermittent headache yesterday; no symptoms today. Appt made with Criselda Peaches for tomorrow AM per availability. Care advise given per protocol. Instructed to bring BP journal to appt.. Pt verbalizes understanding.  Reason for Disposition . Systolic BP  >= 160 OR Diastolic >= 100  Answer Assessment - Initial Assessment Questions 1. BLOOD PRESSURE: "What is the blood pressure?" "Did you take at least two measurements 5 minutes apart?"    No  At work 2. ONSET: "When did you take your blood pressure?"    This AM, has been keeping journal 3. HOW: "How did you obtain the blood pressure?" (e.g., visiting nurse, automatic home BP monitor)     Home monitor, arm 4. HISTORY: "Do you have a history of high blood pressure?"     Recently 5. MEDICATIONS: "Are you taking any medications for blood pressure?" "Have you missed any doses recently?"     no 6. OTHER SYMPTOMS: "Do you have any symptoms?" (e.g., headache, chest pain, blurred vision, difficulty breathing, weakness)     Headache yesterday, increased fatigue  Protocols used: HIGH BLOOD PRESSURE-A-AH

## 2018-09-03 ENCOUNTER — Encounter: Payer: Self-pay | Admitting: Nurse Practitioner

## 2018-09-03 ENCOUNTER — Ambulatory Visit (INDEPENDENT_AMBULATORY_CARE_PROVIDER_SITE_OTHER): Payer: Managed Care, Other (non HMO) | Admitting: Nurse Practitioner

## 2018-09-03 ENCOUNTER — Other Ambulatory Visit: Payer: Self-pay

## 2018-09-03 VITALS — BP 144/90 | HR 87 | Temp 98.8°F | Ht 67.0 in | Wt 151.0 lb

## 2018-09-03 DIAGNOSIS — E559 Vitamin D deficiency, unspecified: Secondary | ICD-10-CM | POA: Diagnosis not present

## 2018-09-03 DIAGNOSIS — Z13220 Encounter for screening for lipoid disorders: Secondary | ICD-10-CM | POA: Diagnosis not present

## 2018-09-03 DIAGNOSIS — I10 Essential (primary) hypertension: Secondary | ICD-10-CM | POA: Diagnosis not present

## 2018-09-03 DIAGNOSIS — Z1329 Encounter for screening for other suspected endocrine disorder: Secondary | ICD-10-CM

## 2018-09-03 MED ORDER — CHOLECALCIFEROL 1.25 MG (50000 UT) PO CAPS: 50000.0000 [IU] | ORAL_CAPSULE | ORAL | 0 refills | Status: DC

## 2018-09-03 MED ORDER — LISINOPRIL 10 MG PO TABS: 10.0000 mg | ORAL_TABLET | Freq: Every day | ORAL | 3 refills | Status: DC

## 2018-09-03 NOTE — Assessment & Plan Note (Signed)
Continues to run higher BP readings at home and at baseline anxiety present.  Will initiate Lisinopril 10 MG daily.  She has follow-up in 3 weeks for physical.  Discussed at length s/s stroke to monitor, currently no symptoms present.  She will continue to monitor BP at home.  Baseline labs today.

## 2018-09-03 NOTE — Assessment & Plan Note (Signed)
On review low levels noted 17-19 range with no supplementation reported.  Will start cholecalciferol 50000 units weekly x 6 weeks and then transition to daily 2000 units.  Vit D level today.

## 2018-09-03 NOTE — Patient Instructions (Addendum)
Take 40981 units Vitamin D3 weekly x 6 weeks and then switch to over the counter Vitamin D 3 2000 units daily after wekly doses complete.     Warning Signs of a Stroke  A stroke is a medical emergency and should be treated right away-every second counts. A stroke is caused by a decrease or block in blood flow to the brain. When this occurs, certain areas of the brain do not get enough oxygen, and brain cells begin to die. A stroke can lead to brain damage and can sometimes be life-threatening. However, if someone having a stroke gets medical treatment right away, he or she has better chances of surviving and recovering from the stroke. Being able to recognize the symptoms of a stroke is very important. Types of strokes There are two main types of strokes:  Ischemic strokes. This is the most common type of stroke. These strokes happen when a blood vessel that supplies blood to the brain is being blocked.  Hemorrhagic strokes. These strokes result from bleeding in the brain due to a blood vessel leaking or bursting (rupturing). A transient ischemic attack (TIA) is a "warning stroke" that causes stroke-like symptoms that go away quickly. Unlike a stroke, a TIA does not cause permanent damage to the brain. However, the symptoms of a TIA are the same as a stroke, and they also require medical treatment right away. Having a TIA is a sign that you are at higher risk for a permanent stroke. Warning signs of a stroke The symptoms of stroke may vary and will reflect the part of the brain that is involved. Symptoms usually happen suddenly. "BE FAST" is an easy way to remember the main warning signs of a stroke. B - Balance Signs are dizziness, sudden trouble walking, or loss of balance. E - Eyes Signs are trouble seeing or a sudden change in vision. F - Face Signs are sudden weakness or numbness of the face, or the face or eyelid drooping on one side. A - Arms Signs are weakness or numbness in an arm.  This happens suddenly and usually on one side of the body. S - Speech Signs are sudden trouble speaking, slurred speech, or trouble understanding what people say. T - Time Time to call emergency services. Write down what time symptoms started. Other signs of a stroke Some less common signs of a stroke include:  A sudden, severe headache with no known cause.  Nausea or vomiting.  Seizure. A stroke may be happening even if only one "BE FAST" symptoms is present. These symptoms may represent a serious problem that is an emergency. Do not wait to see if the symptoms will go away. Get medical help right away. Call your local emergency services (911 in the [place] Do not drive yourself to the hospital. Summary  A stroke is a medical emergency and should be treated right away-every second counts.  "BE FAST" is an easy way to remember the main warning signs of a stroke.  Call local emergency services right away if you or someone else has any stroke symptoms, even if the symptoms go away.  Make note of what time the first symptoms appeared. Emergency responders or emergency room staff will need to know this information.  Do not wait to see if symptoms will go away. Call 911 even if only one of the "BE FAST" symptoms appears. This information is not intended to replace advice given to you by your health care provider. Make sure you discuss  any questions you have with your health care provider. Document Released: 11/23/2016 Document Revised: 11/23/2016 Document Reviewed: 11/23/2016 Elsevier Interactive Patient Education  2019 [employer]   DASH Eating Plan DASH stands for "Dietary Approaches to Stop Hypertension." The DASH eating plan is a healthy eating plan that has been shown to reduce high blood pressure (hypertension). It may also reduce your risk for type 2 diabetes, heart disease, and stroke. The DASH eating plan may also help with weight loss. What are tips for following this  plan?  General guidelines  Avoid eating more than 2,300 mg (milligrams) of salt (sodium) a day. If you have hypertension, you may need to reduce your sodium intake to 1,500 mg a day.  Limit alcohol intake to no more than 1 drink a day for nonpregnant women and 2 drinks a day for men. One drink equals 12 oz of beer, 5 oz of wine, or 1 oz of hard liquor.  Work with your health care provider to maintain a healthy body weight or to lose weight. Ask what an ideal weight is for you.  Get at least 30 minutes of exercise that causes your heart to beat faster (aerobic exercise) most days of the week. Activities may include walking, swimming, or biking.  Work with your health care provider or diet and nutrition specialist (dietitian) to adjust your eating plan to your individual calorie needs. Reading food labels   Check food labels for the amount of sodium per serving. Choose foods with less than 5 percent of the Daily Value of sodium. Generally, foods with less than 300 mg of sodium per serving fit into this eating plan.  To find whole grains, look for the word "whole" as the first word in the ingredient list. Shopping  Buy products labeled as "low-sodium" or "no salt added."  Buy fresh foods. Avoid canned foods and premade or frozen meals. Cooking  Avoid adding salt when cooking. Use salt-free seasonings or herbs instead of table salt or sea salt. Check with your health care provider or pharmacist before using salt substitutes.  Do not fry foods. Cook foods using healthy methods such as baking, boiling, grilling, and broiling instead.  Cook with heart-healthy oils, such as olive, canola, soybean, or sunflower oil. Meal planning  Eat a balanced diet that includes: ? 5 or more servings of fruits and vegetables each day. At each meal, try to fill half of your plate with fruits and vegetables. ? Up to 6-8 servings of whole grains each day. ? Less than 6 oz of lean meat, poultry, or fish  each day. A 3-oz serving of meat is about the same size as a deck of cards. One egg equals 1 oz. ? 2 servings of low-fat dairy each day. ? A serving of nuts, seeds, or beans 5 times each week. ? Heart-healthy fats. Healthy fats called Omega-3 fatty acids are found in foods such as flaxseeds and coldwater fish, like sardines, salmon, and mackerel.  Limit how much you eat of the following: ? Canned or prepackaged foods. ? Food that is high in trans fat, such as fried foods. ? Food that is high in saturated fat, such as fatty meat. ? Sweets, desserts, sugary drinks, and other foods with added sugar. ? Full-fat dairy products.  Do not salt foods before eating.  Try to eat at least 2 vegetarian meals each week.  Eat more home-cooked food and less restaurant, buffet, and fast food.  When eating at a restaurant, ask that your  food be prepared with less salt or no salt, if possible. What foods are recommended? The items listed may not be a complete list. Talk with your dietitian about what dietary choices are best for you. Grains Whole-grain or whole-wheat bread. Whole-grain or whole-wheat pasta. Brown rice. Orpah Cobb. Bulgur. Whole-grain and low-sodium cereals. Pita bread. Low-fat, low-sodium crackers. Whole-wheat flour tortillas. Vegetables Fresh or frozen vegetables (raw, steamed, roasted, or grilled). Low-sodium or reduced-sodium tomato and vegetable juice. Low-sodium or reduced-sodium tomato sauce and tomato paste. Low-sodium or reduced-sodium canned vegetables. Fruits All fresh, dried, or frozen fruit. Canned fruit in natural juice (without added sugar). Meat and other protein foods Skinless chicken or [place]. Ground chicken or [place]. Pork with fat trimmed off. Fish and seafood. Egg whites. Dried beans, peas, or lentils. Unsalted nuts, nut butters, and seeds. Unsalted canned beans. Lean cuts of beef with fat trimmed off. Low-sodium, lean deli meat. Dairy Low-fat (1%) or fat-free  (skim) milk. Fat-free, low-fat, or reduced-fat cheeses. Nonfat, low-sodium ricotta or cottage cheese. Low-fat or nonfat yogurt. Low-fat, low-sodium cheese. Fats and oils Soft margarine without trans fats. Vegetable oil. Low-fat, reduced-fat, or light mayonnaise and salad dressings (reduced-sodium). Canola, safflower, olive, soybean, and sunflower oils. Avocado. Seasoning and other foods Herbs. Spices. Seasoning mixes without salt. Unsalted popcorn and pretzels. Fat-free sweets. What foods are not recommended? The items listed may not be a complete list. Talk with your dietitian about what dietary choices are best for you. Grains Baked goods made with fat, such as croissants, muffins, or some breads. Dry pasta or rice meal packs. Vegetables Creamed or fried vegetables. Vegetables in a cheese sauce. Regular canned vegetables (not low-sodium or reduced-sodium). Regular canned tomato sauce and paste (not low-sodium or reduced-sodium). Regular tomato and vegetable juice (not low-sodium or reduced-sodium). Rosita Fire. Olives. Fruits Canned fruit in a light or heavy syrup. Fried fruit. Fruit in cream or butter sauce. Meat and other protein foods Fatty cuts of meat. Ribs. Fried meat. Tomasa Blase. Sausage. Bologna and other processed lunch meats. Salami. Fatback. Hotdogs. Bratwurst. Salted nuts and seeds. Canned beans with added salt. Canned or smoked fish. Whole eggs or egg yolks. Chicken or [place] with skin. Dairy Whole or 2% milk, cream, and half-and-half. Whole or full-fat cream cheese. Whole-fat or sweetened yogurt. Full-fat cheese. Nondairy creamers. Whipped toppings. Processed cheese and cheese spreads. Fats and oils Butter. Stick margarine. Lard. Shortening. Ghee. Bacon fat. Tropical oils, such as coconut, palm kernel, or palm oil. Seasoning and other foods Salted popcorn and pretzels. Onion salt, garlic salt, seasoned salt, table salt, and sea salt. Worcestershire sauce. Tartar sauce. Barbecue sauce.  Teriyaki sauce. Soy sauce, including reduced-sodium. Steak sauce. Canned and packaged gravies. Fish sauce. Oyster sauce. Cocktail sauce. Horseradish that you find on the shelf. Ketchup. Mustard. Meat flavorings and tenderizers. Bouillon cubes. Hot sauce and Tabasco sauce. Premade or packaged marinades. Premade or packaged taco seasonings. Relishes. Regular salad dressings. Where to find more information:  National Heart, Lung, and Blood Institute: PopSteam.is  American Heart Association: www.heart.org Summary  The DASH eating plan is a healthy eating plan that has been shown to reduce high blood pressure (hypertension). It may also reduce your risk for type 2 diabetes, heart disease, and stroke.  With the DASH eating plan, you should limit salt (sodium) intake to 2,300 mg a day. If you have hypertension, you may need to reduce your sodium intake to 1,500 mg a day.  When on the DASH eating plan, aim to eat more fresh fruits and vegetables,  whole grains, lean proteins, low-fat dairy, and heart-healthy fats.  Work with your health care provider or diet and nutrition specialist (dietitian) to adjust your eating plan to your individual calorie needs. This information is not intended to replace advice given to you by your health care provider. Make sure you discuss any questions you have with your health care provider. Document Released: 07/26/2011 Document Revised: 07/30/2016 Document Reviewed: 07/30/2016 Elsevier Interactive Patient Education  2019 [employer] Hypertension Hypertension, commonly called high blood pressure, is when the force of blood pumping through the arteries is too strong. The arteries are the blood vessels that carry blood from the heart throughout the body. Hypertension forces the heart to work harder to pump blood and may cause arteries to become narrow or stiff. Having untreated or uncontrolled hypertension can cause heart attacks, strokes, kidney disease, and other  problems. A blood pressure reading consists of a higher number over a lower number. Ideally, your blood pressure should be below 120/80. The first ("top") number is called the systolic pressure. It is a measure of the pressure in your arteries as your heart beats. The second ("bottom") number is called the diastolic pressure. It is a measure of the pressure in your arteries as the heart relaxes. What are the causes? The cause of this condition is not known. What increases the risk? Some risk factors for high blood pressure are under your control. Others are not. Factors you can change  Smoking.  Having type 2 diabetes mellitus, high cholesterol, or both.  Not getting enough exercise or physical activity.  Being overweight.  Having too much fat, sugar, calories, or salt (sodium) in your diet.  Drinking too much alcohol. Factors that are difficult or impossible to change  Having chronic kidney disease.  Having a family history of high blood pressure.  Age. Risk increases with age.  Race. You may be at higher risk if you are African-American.  Gender. Men are at higher risk than women before age 54. After age 54, women are at higher risk than men.  Having obstructive sleep apnea.  Stress. What are the signs or symptoms? Extremely high blood pressure (hypertensive crisis) may cause:  Headache.  Anxiety.  Shortness of breath.  Nosebleed.  Nausea and vomiting.  Severe chest pain.  Jerky movements you cannot control (seizures). How is this diagnosed? This condition is diagnosed by measuring your blood pressure while you are seated, with your arm resting on a surface. The cuff of the blood pressure monitor will be placed directly against the skin of your upper arm at the level of your heart. It should be measured at least twice using the same arm. Certain conditions can cause a difference in blood pressure between your right and left arms. Certain factors can cause blood  pressure readings to be lower or higher than normal (elevated) for a short period of time:  When your blood pressure is higher when you are in a health care provider's office than when you are at home, this is called white coat hypertension. Most people with this condition do not need medicines.  When your blood pressure is higher at home than when you are in a health care provider's office, this is called masked hypertension. Most people with this condition may need medicines to control blood pressure. If you have a high blood pressure reading during one visit or you have normal blood pressure with other risk factors:  You may be asked to return on a different day to  have your blood pressure checked again.  You may be asked to monitor your blood pressure at home for 1 week or longer. If you are diagnosed with hypertension, you may have other blood or imaging tests to help your health care provider understand your overall risk for other conditions. How is this treated? This condition is treated by making healthy lifestyle changes, such as eating healthy foods, exercising more, and reducing your alcohol intake. Your health care provider may prescribe medicine if lifestyle changes are not enough to get your blood pressure under control, and if:  Your systolic blood pressure is above 130.  Your diastolic blood pressure is above 80. Your personal target blood pressure may vary depending on your medical conditions, your age, and other factors. Follow these instructions at home: Eating and drinking   Eat a diet that is high in fiber and potassium, and low in sodium, added sugar, and fat. An example eating plan is called the DASH (Dietary Approaches to Stop Hypertension) diet. To eat this way: ? Eat plenty of fresh fruits and vegetables. Try to fill half of your plate at each meal with fruits and vegetables. ? Eat whole grains, such as whole wheat pasta, brown rice, or whole grain bread. Fill about  one quarter of your plate with whole grains. ? Eat or drink low-fat dairy products, such as skim milk or low-fat yogurt. ? Avoid fatty cuts of meat, processed or cured meats, and poultry with skin. Fill about one quarter of your plate with lean proteins, such as fish, chicken without skin, beans, eggs, and tofu. ? Avoid premade and processed foods. These tend to be higher in sodium, added sugar, and fat.  Reduce your daily sodium intake. Most people with hypertension should eat less than 1,500 mg of sodium a day.  Limit alcohol intake to no more than 1 drink a day for nonpregnant women and 2 drinks a day for men. One drink equals 12 oz of beer, 5 oz of wine, or 1 oz of hard liquor. Lifestyle   Work with your health care provider to maintain a healthy body weight or to lose weight. Ask what an ideal weight is for you.  Get at least 30 minutes of exercise that causes your heart to beat faster (aerobic exercise) most days of the week. Activities may include walking, swimming, or biking.  Include exercise to strengthen your muscles (resistance exercise), such as pilates or lifting weights, as part of your weekly exercise routine. Try to do these types of exercises for 30 minutes at least 3 days a week.  Do not use any products that contain nicotine or tobacco, such as cigarettes and e-cigarettes. If you need help quitting, ask your health care provider.  Monitor your blood pressure at home as told by your health care provider.  Keep all follow-up visits as told by your health care provider. This is important. Medicines  Take over-the-counter and prescription medicines only as told by your health care provider. Follow directions carefully. Blood pressure medicines must be taken as prescribed.  Do not skip doses of blood pressure medicine. Doing this puts you at risk for problems and can make the medicine less effective.  Ask your health care provider about side effects or reactions to  medicines that you should watch for. Contact a health care provider if:  You think you are having a reaction to a medicine you are taking.  You have headaches that keep coming back (recurring).  You feel dizzy.  You have swelling in your ankles.  You have trouble with your vision. Get help right away if:  You develop a severe headache or confusion.  You have unusual weakness or numbness.  You feel faint.  You have severe pain in your chest or abdomen.  You vomit repeatedly.  You have trouble breathing. Summary  Hypertension is when the force of blood pumping through your arteries is too strong. If this condition is not controlled, it may put you at risk for serious complications.  Your personal target blood pressure may vary depending on your medical conditions, your age, and other factors. For most people, a normal blood pressure is less than 120/80.  Hypertension is treated with lifestyle changes, medicines, or a combination of both. Lifestyle changes include weight loss, eating a healthy, low-sodium diet, exercising more, and limiting alcohol. This information is not intended to replace advice given to you by your health care provider. Make sure you discuss any questions you have with your health care provider. Document Released: 08/06/2005 Document Revised: 07/04/2016 Document Reviewed: 07/04/2016 Elsevier Interactive Patient Education  2019 [employer].

## 2018-09-03 NOTE — Progress Notes (Signed)
BP (!) 144/90 (BP Location: Left Arm, Patient Position: Sitting, Cuff Size: Normal)   Pulse 87   Temp 98.8 F (37.1 C) (Oral)   Ht 5\' 7"  (1.702 m)   Wt 151 lb (68.5 kg)   SpO2 100%   BMI 23.65 kg/m    Subjective:    Patient ID: Kathy Thompson, female    DOB: 1966-06-17, 53 y.o.   MRN: [id number]  HPI: Kathy Thompson is a 53 y.o. female  Chief Complaint  Patient presents with  . Hypertension    pt states BP has being high for about 2-3 week  . Headache   HYPERTENSION Has been monitoring BP at home per previous instructions, reports they have been running higher and has headache presenting on occasion.  At home range 140/90 to 178/101.  Strong family history of hypertension with mom and sister, no h/o strokes in family.  She uses electronic cuff upper arm at home. Hypertension status: uncontrolled  Satisfied with current treatment? no current medications Duration of hypertension: months BP monitoring frequency:  daily BP range: 140/90 to 178/101 Aspirin: no Recurrent headaches: yes Visual changes: no Palpitations: no Dyspnea: no Chest pain: no Lower extremity edema: no Dizzy/lightheaded: no   VITAMIN D DEFICIENCY: No current supplementation.  Noted levels 17-19 on history of labs.  Relevant past medical, surgical, family and social history reviewed and updated as indicated. Interim medical history since our last visit reviewed. Allergies and medications reviewed and updated.  Review of Systems  Constitutional: Negative for activity change, appetite change, diaphoresis, fatigue and fever.  Respiratory: Negative for cough, chest tightness and shortness of breath.   Cardiovascular: Negative for chest pain, palpitations and leg swelling.  Gastrointestinal: Negative for abdominal distention, abdominal pain, constipation, diarrhea, nausea and vomiting.  Endocrine: Negative for cold intolerance, heat intolerance, polydipsia, polyphagia and polyuria.  Musculoskeletal:  Positive for arthralgias (left arm).  Neurological: Negative for dizziness, syncope, weakness, light-headedness, numbness and headaches.  Psychiatric/Behavioral: Negative.      Per HPI unless specifically indicated above     Objective:    BP (!) 144/90 (BP Location: Left Arm, Patient Position: Sitting, Cuff Size: Normal)   Pulse 87   Temp 98.8 F (37.1 C) (Oral)   Ht 5\' 7"  (1.702 m)   Wt 151 lb (68.5 kg)   SpO2 100%   BMI 23.65 kg/m   Wt Readings from Last 3 Encounters:  09/03/18 151 lb (68.5 kg)  08/25/18 151 lb (68.5 kg)  11/27/16 150 lb (68 kg)    Physical Exam Vitals signs and nursing note reviewed.  Constitutional:      General: She is awake.     Appearance: She is well-developed.  HENT:     Head: Normocephalic.     Right Ear: Hearing normal.     Left Ear: Hearing normal.     Nose: Nose normal.     Mouth/Throat:     Mouth: Mucous membranes are moist.  Eyes:     General: Lids are normal. No visual field deficit.       Right eye: No discharge.        Left eye: No discharge.     Conjunctiva/sclera: Conjunctivae normal.     Pupils: Pupils are equal, round, and reactive to light.  Neck:     Musculoskeletal: Normal range of motion and neck supple.     Thyroid: No thyromegaly.     Vascular: No carotid bruit or JVD.  Cardiovascular:     Rate  and Rhythm: Normal rate and regular rhythm.     Heart sounds: Normal heart sounds. No murmur. No gallop.   Pulmonary:     Effort: Pulmonary effort is normal.     Breath sounds: Normal breath sounds.  Abdominal:     General: Bowel sounds are normal.     Palpations: Abdomen is soft. There is no hepatomegaly or splenomegaly.  Musculoskeletal:     Right lower leg: No edema.     Left lower leg: No edema.  Lymphadenopathy:     Cervical: No cervical adenopathy.  Skin:    General: Skin is warm and dry.  Neurological:     Mental Status: She is alert and oriented to person, place, and time.     Cranial Nerves: No cranial nerve  deficit, dysarthria or facial asymmetry.     Sensory: No sensory deficit.     Motor: No weakness.     Coordination: Romberg sign negative.     Gait: Gait is intact.     Deep Tendon Reflexes: Reflexes are normal and symmetric.     Reflex Scores:      Brachioradialis reflexes are 2+ on the right side and 2+ on the left side.      Patellar reflexes are 2+ on the right side and 2+ on the left side.    Comments: Cranial nerves intact and equal strength BUE and BLE.  Psychiatric:        Attention and Perception: Attention normal.        Mood and Affect: Mood normal.        Behavior: Behavior normal. Behavior is cooperative.        Thought Content: Thought content normal.        Judgment: Judgment normal.     Results for orders placed or performed in visit on 09/06/17  [facility]  Result Value Ref Range   FSH 4.4 mIU/mL  QuantiFERON-TB Gold Plus  Result Value Ref Range   QuantiFERON Incubation Incubation performed.    QuantiFERON Criteria Comment    QuantiFERON TB1 Ag Value 0.03 IU/mL   QuantiFERON TB2 Ag Value 0.04 IU/mL   QuantiFERON Nil Value 0.04 IU/mL   QuantiFERON Mitogen Value >10.00 IU/mL   QuantiFERON-TB Gold Plus Negative Negative  Executive Panel  Result Value Ref Range   Glucose 79 65 - 99 mg/dL   Uric Acid 3.1 2.5 - 7.1 mg/dL   BUN 11 6 - 24 mg/dL   Creatinine, Ser 4.090.67 0.57 - 1.00 mg/dL   GFR calc non Af Amer 101 >59 mL/min/1.73   GFR calc Af Amer 117 >59 mL/min/1.73   BUN/Creatinine Ratio 16 9 - 23   Sodium 139 134 - 144 mmol/L   Potassium 4.3 3.5 - 5.2 mmol/L   Chloride 103 96 - 106 mmol/L   Calcium 9.1 8.7 - 10.2 mg/dL   Phosphorus 3.3 2.5 - 4.5 mg/dL   Total Protein 6.9 6.0 - 8.5 g/dL   Albumin 4.3 3.5 - 5.5 g/dL   Globulin, Total 2.6 1.5 - 4.5 g/dL   Albumin/Globulin Ratio 1.7 1.2 - 2.2   Bilirubin Total 0.4 0.0 - 1.2 mg/dL   Alkaline Phosphatase 58 39 - 117 IU/L   LDH 162 119 - 226 IU/L   AST 14 0 - 40 IU/L   ALT 10 0 - 32 IU/L   GGT 9 0 - 60 IU/L   Iron  107 27 - 159 ug/dL   Cholesterol, Total 811177 100 - 199 mg/dL   Triglycerides  51 0 - 149 mg/dL   HDL 62 >16>39 mg/dL   VLDL Cholesterol Cal 10 5 - 40 mg/dL   LDL Calculated 109105 (H) 0 - 99 mg/dL   Chol/HDL Ratio 2.9 0.0 - 4.4 ratio   Estimated CHD Risk  < 0.5 0.0 - 1.0 times avg.   TSH 1.280 0.450 - 4.500 uIU/mL   T4, Total 8.3 4.5 - 12.0 ug/dL   T3 Uptake Ratio 23 (L) 24 - 39 %   Free Thyroxine Index 1.9 1.2 - 4.9   WBC 8.3 3.4 - 10.8 x10E3/uL   RBC 4.61 3.77 - 5.28 x10E6/uL   Hemoglobin 14.9 11.1 - 15.9 g/dL   Hematocrit 60.443.6 54.034.0 - 46.6 %   MCV 95 79 - 97 fL   MCH 32.3 26.6 - 33.0 pg   MCHC 34.2 31.5 - 35.7 g/dL   RDW 98.113.4 19.112.3 - 47.815.4 %   Platelets 266 150 - 379 x10E3/uL   Neutrophils 59 Not Estab. %   Lymphs 32 Not Estab. %   Monocytes 5 Not Estab. %   Eos 3 Not Estab. %   Basos 1 Not Estab. %   Neutrophils Absolute 4.9 1.4 - 7.0 x10E3/uL   Lymphocytes Absolute 2.6 0.7 - 3.1 x10E3/uL   Monocytes Absolute 0.5 0.1 - 0.9 x10E3/uL   EOS (ABSOLUTE) 0.2 0.0 - 0.4 x10E3/uL   Basophils Absolute 0.1 0.0 - 0.2 x10E3/uL   Immature Granulocytes 0 Not Estab. %   Immature Grans (Abs) 0.0 0.0 - 0.1 x10E3/uL  Vitamin D (25 hydroxy)  Result Value Ref Range   Vit D, 25-Hydroxy 17.9 (L) 30.0 - 100.0 ng/mL      Assessment & Plan:   Problem List Items Addressed This Visit      Cardiovascular and Mediastinum   Essential hypertension - Primary    Continues to run higher BP readings at home and at baseline anxiety present.  Will initiate Lisinopril 10 MG daily.  She has follow-up in 3 weeks for physical.  Discussed at length s/s stroke to monitor, currently no symptoms present.  She will continue to monitor BP at home.  Baseline labs today.      Relevant Medications   lisinopril (PRINIVIL,ZESTRIL) 10 MG tablet   Other Relevant Orders   CBC with Differential/Platelet   Comprehensive metabolic panel     Other   Vitamin D deficiency    On review low levels noted 17-19 range with no  supplementation reported.  Will start cholecalciferol 50000 units weekly x 6 weeks and then transition to daily 2000 units.  Vit D level today.      Relevant Orders   Vitamin D (25 hydroxy)    Other Visit Diagnoses    Thyroid disorder screen       Relevant Orders   Thyroid Panel With TSH   Screening cholesterol level       Relevant Orders   Lipid Panel w/o Chol/HDL Ratio       Follow up plan: Return in about 4 weeks (around 10/01/2018) for HTN check (she can keep scheduled appointment if has one).

## 2018-09-04 ENCOUNTER — Other Ambulatory Visit: Payer: Self-pay | Admitting: Nurse Practitioner

## 2018-09-04 LAB — CBC WITH DIFFERENTIAL/PLATELET
Basophils Absolute: 0.1 10*3/uL (ref 0.0–0.2)
Basos: 1 %
EOS (ABSOLUTE): 0.2 10*3/uL (ref 0.0–0.4)
Eos: 2 %
Hematocrit: 43.9 % (ref 34.0–46.6)
Hemoglobin: 15.4 g/dL (ref 11.1–15.9)
IMMATURE GRANS (ABS): 0.1 10*3/uL (ref 0.0–0.1)
Immature Granulocytes: 1 %
LYMPHS: 22 %
Lymphocytes Absolute: 2 10*3/uL (ref 0.7–3.1)
MCH: 32.4 pg (ref 26.6–33.0)
MCHC: 35.1 g/dL (ref 31.5–35.7)
MCV: 92 fL (ref 79–97)
Monocytes Absolute: 0.6 10*3/uL (ref 0.1–0.9)
Monocytes: 7 %
Neutrophils Absolute: 6.3 10*3/uL (ref 1.4–7.0)
Neutrophils: 67 %
PLATELETS: 289 10*3/uL (ref 150–450)
RBC: 4.76 x10E6/uL (ref 3.77–5.28)
RDW: 12.5 % (ref 11.7–15.4)
WBC: 9.3 10*3/uL (ref 3.4–10.8)

## 2018-09-04 LAB — COMPREHENSIVE METABOLIC PANEL
ALT: 12 IU/L (ref 0–32)
AST: 9 IU/L (ref 0–40)
Albumin/Globulin Ratio: 1.8 (ref 1.2–2.2)
Albumin: 4.4 g/dL (ref 3.5–5.5)
Alkaline Phosphatase: 53 IU/L (ref 39–117)
BUN/Creatinine Ratio: 20 (ref 9–23)
BUN: 13 mg/dL (ref 6–24)
Bilirubin Total: 0.3 mg/dL (ref 0.0–1.2)
CHLORIDE: 103 mmol/L (ref 96–106)
CO2: 21 mmol/L (ref 20–29)
Calcium: 9.3 mg/dL (ref 8.7–10.2)
Creatinine, Ser: 0.64 mg/dL (ref 0.57–1.00)
GFR calc Af Amer: 118 mL/min/{1.73_m2} (ref >59)
GFR calc non Af Amer: 102 mL/min/{1.73_m2} (ref >59)
Globulin, Total: 2.5 g/dL (ref 1.5–4.5)
Glucose: 92 mg/dL (ref 65–99)
Potassium: 4.2 mmol/L (ref 3.5–5.2)
Sodium: 139 mmol/L (ref 134–144)
Total Protein: 6.9 g/dL (ref 6.0–8.5)

## 2018-09-04 LAB — LIPID PANEL W/O CHOL/HDL RATIO
Cholesterol, Total: 176 mg/dL (ref 100–199)
HDL: 68 mg/dL (ref >39)
LDL Calculated: 98 mg/dL (ref 0–99)
Triglycerides: 51 mg/dL (ref 0–149)
VLDL Cholesterol Cal: 10 mg/dL (ref 5–40)

## 2018-09-04 LAB — THYROID PANEL WITH TSH
Free Thyroxine Index: 2.1 (ref 1.2–4.9)
T3 Uptake Ratio: 23 % — ABNORMAL LOW (ref 24–39)
T4, Total: 9.1 ug/dL (ref 4.5–12.0)
TSH: 1.24 u[IU]/mL (ref 0.450–4.500)

## 2018-09-04 LAB — VITAMIN D 25 HYDROXY (VIT D DEFICIENCY, FRACTURES): Vit D, 25-Hydroxy: 13.4 ng/mL — ABNORMAL LOW (ref 30.0–100.0)

## 2018-09-22 ENCOUNTER — Ambulatory Visit: Payer: Managed Care, Other (non HMO) | Admitting: Nurse Practitioner

## 2018-10-08 ENCOUNTER — Other Ambulatory Visit: Payer: Self-pay | Admitting: Nurse Practitioner

## 2018-10-14 ENCOUNTER — Other Ambulatory Visit: Payer: Self-pay

## 2018-10-14 ENCOUNTER — Ambulatory Visit (INDEPENDENT_AMBULATORY_CARE_PROVIDER_SITE_OTHER): Payer: Managed Care, Other (non HMO) | Admitting: Nurse Practitioner

## 2018-10-14 ENCOUNTER — Encounter: Payer: Self-pay | Admitting: Nurse Practitioner

## 2018-10-14 VITALS — BP 119/80 | HR 83 | Temp 98.1°F | Ht 66.93 in | Wt 152.0 lb

## 2018-10-14 DIAGNOSIS — I10 Essential (primary) hypertension: Secondary | ICD-10-CM

## 2018-10-14 DIAGNOSIS — E559 Vitamin D deficiency, unspecified: Secondary | ICD-10-CM

## 2018-10-14 NOTE — Assessment & Plan Note (Signed)
Chronic, ongoing.  Continue current supplement.  Recheck Vitamin D level today and if stable change from weekly to daily supplement 1000 units.

## 2018-10-14 NOTE — Assessment & Plan Note (Signed)
Chronic, stable.  BP now below goal at home and on check today.  Continue Lisinopril 10 MG daily, no current ADR.  BMP today.  Return in 6 months.

## 2018-10-14 NOTE — Progress Notes (Signed)
BP 119/80 (BP Location: Left Arm, Patient Position: Sitting, Cuff Size: Normal)   Pulse 83   Temp 98.1 F (36.7 C) (Oral)   Ht 5' 6.93" (1.7 m)   Wt 152 lb (68.9 kg)   SpO2 98%   BMI 23.86 kg/m    Subjective:    Patient ID: Kathy Thompson, female    DOB: March 20, 1966, 53 y.o.   MRN: [id number]  HPI: Kathy Thompson is a 53 y.o. female  Chief Complaint  Patient presents with  . Hypertension    FU   HYPERTENSION Started on Lisinopril 10 MG daily on 09/03/2018 due to elevations in BP > 130/90.   Hypertension status: controlled  Satisfied with current treatment? yes Duration of hypertension: months BP monitoring frequency:  a few times a week BP range: last night 124/74 and on average 120/70's at home BP medication side effects:  no Medication compliance: excellent compliance Aspirin: no Recurrent headaches: no Visual changes: no Palpitations: no Dyspnea: no Chest pain: no Lower extremity edema: no Dizzy/lightheaded: no    VITAMIN D DEFICIENCY: On 09/03/2018 was noted on labs to have a Vitamin D level of 13.4, was started on weekly Vitamin D 50,000 units.  Denies bone pain, muscle aches, fatigue, or recent falls/fractures.   Relevant past medical, surgical, family and social history reviewed and updated as indicated. Interim medical history since our last visit reviewed. Allergies and medications reviewed and updated.  Review of Systems  Constitutional: Negative for activity change, appetite change, diaphoresis, fatigue and fever.  Respiratory: Negative for cough, chest tightness and shortness of breath.   Cardiovascular: Negative for chest pain, palpitations and leg swelling.  Gastrointestinal: Negative for abdominal distention, abdominal pain, constipation, diarrhea, nausea and vomiting.  Endocrine: Negative for cold intolerance, heat intolerance, polydipsia, polyphagia and polyuria.  Neurological: Negative for dizziness, syncope, weakness, light-headedness,  numbness and headaches.  Psychiatric/Behavioral: Negative.     Per HPI unless specifically indicated above     Objective:    BP 119/80 (BP Location: Left Arm, Patient Position: Sitting, Cuff Size: Normal)   Pulse 83   Temp 98.1 F (36.7 C) (Oral)   Ht 5' 6.93" (1.7 m)   Wt 152 lb (68.9 kg)   SpO2 98%   BMI 23.86 kg/m   Wt Readings from Last 3 Encounters:  10/14/18 152 lb (68.9 kg)  09/03/18 151 lb (68.5 kg)  08/25/18 151 lb (68.5 kg)    Physical Exam Vitals signs and nursing note reviewed.  Constitutional:      General: She is awake.     Appearance: She is well-developed.  HENT:     Head: Normocephalic.  Eyes:     General:        Right eye: No discharge.        Left eye: No discharge.     Conjunctiva/sclera: Conjunctivae normal.     Pupils: Pupils are equal, round, and reactive to light.  Neck:     Musculoskeletal: Normal range of motion and neck supple.     Thyroid: No thyromegaly.     Vascular: No carotid bruit or JVD.  Cardiovascular:     Rate and Rhythm: Normal rate and regular rhythm.     Heart sounds: Normal heart sounds.  Pulmonary:     Effort: Pulmonary effort is normal.     Breath sounds: Normal breath sounds.  Abdominal:     General: Bowel sounds are normal.     Palpations: Abdomen is soft.  Lymphadenopathy:  Cervical: No cervical adenopathy.  Skin:    General: Skin is warm and dry.  Neurological:     Mental Status: She is alert and oriented to person, place, and time.  Psychiatric:        Mood and Affect: Mood normal.        Behavior: Behavior normal. Behavior is cooperative.        Thought Content: Thought content normal.        Judgment: Judgment normal.     Results for orders placed or performed in visit on 09/03/18  CBC with Differential/Platelet  Result Value Ref Range   WBC 9.3 3.4 - 10.8 x10E3/uL   RBC 4.76 3.77 - 5.28 x10E6/uL   Hemoglobin 15.4 11.1 - 15.9 g/dL   Hematocrit 56.2 13.0 - 46.6 %   MCV 92 79 - 97 fL   MCH 32.4  26.6 - 33.0 pg   MCHC 35.1 31.5 - 35.7 g/dL   RDW 86.5 78.4 - 69.6 %   Platelets 289 150 - 450 x10E3/uL   Neutrophils 67 Not Estab. %   Lymphs 22 Not Estab. %   Monocytes 7 Not Estab. %   Eos 2 Not Estab. %   Basos 1 Not Estab. %   Neutrophils Absolute 6.3 1.4 - 7.0 x10E3/uL   Lymphocytes Absolute 2.0 0.7 - 3.1 x10E3/uL   Monocytes Absolute 0.6 0.1 - 0.9 x10E3/uL   EOS (ABSOLUTE) 0.2 0.0 - 0.4 x10E3/uL   Basophils Absolute 0.1 0.0 - 0.2 x10E3/uL   Immature Granulocytes 1 Not Estab. %   Immature Grans (Abs) 0.1 0.0 - 0.1 x10E3/uL  Comprehensive metabolic panel  Result Value Ref Range   Glucose 92 65 - 99 mg/dL   BUN 13 6 - 24 mg/dL   Creatinine, Ser 2.95 0.57 - 1.00 mg/dL   GFR calc non Af Amer 102 >59 mL/min/1.73   GFR calc Af Amer 118 >59 mL/min/1.73   BUN/Creatinine Ratio 20 9 - 23   Sodium 139 134 - 144 mmol/L   Potassium 4.2 3.5 - 5.2 mmol/L   Chloride 103 96 - 106 mmol/L   CO2 21 20 - 29 mmol/L   Calcium 9.3 8.7 - 10.2 mg/dL   Total Protein 6.9 6.0 - 8.5 g/dL   Albumin 4.4 3.5 - 5.5 g/dL   Globulin, Total 2.5 1.5 - 4.5 g/dL   Albumin/Globulin Ratio 1.8 1.2 - 2.2   Bilirubin Total 0.3 0.0 - 1.2 mg/dL   Alkaline Phosphatase 53 39 - 117 IU/L   AST 9 0 - 40 IU/L   ALT 12 0 - 32 IU/L  Lipid Panel w/o Chol/HDL Ratio  Result Value Ref Range   Cholesterol, Total 176 100 - 199 mg/dL   Triglycerides 51 0 - 149 mg/dL   HDL 68 >28 mg/dL   VLDL Cholesterol Cal 10 5 - 40 mg/dL   LDL Calculated 98 0 - 99 mg/dL  Vitamin D (25 hydroxy)  Result Value Ref Range   Vit D, 25-Hydroxy 13.4 (L) 30.0 - 100.0 ng/mL  Thyroid Panel With TSH  Result Value Ref Range   TSH 1.240 0.450 - 4.500 uIU/mL   T4, Total 9.1 4.5 - 12.0 ug/dL   T3 Uptake Ratio 23 (L) 24 - 39 %   Free Thyroxine Index 2.1 1.2 - 4.9      Assessment & Plan:   Problem List Items Addressed This Visit      Cardiovascular and Mediastinum   Essential hypertension - Primary    Chronic,  stable.  BP now below goal at  home and on check today.  Continue Lisinopril 10 MG daily, no current ADR.  BMP today.  Return in 6 months.      Relevant Orders   Basic Metabolic Panel (BMET)     Other   Vitamin D deficiency    Chronic, ongoing.  Continue current supplement.  Recheck Vitamin D level today and if stable change from weekly to daily supplement 1000 units.      Relevant Orders   VITAMIN D 25 Hydroxy (Vit-D Deficiency, Fractures)       Follow up plan: Return in about 6 months (around 04/14/2019) for HTN and Vitamin D deficiency.

## 2018-10-14 NOTE — Patient Instructions (Signed)
DASH Eating Plan  DASH stands for "Dietary Approaches to Stop Hypertension." The DASH eating plan is a healthy eating plan that has been shown to reduce high blood pressure (hypertension). It may also reduce your risk for type 2 diabetes, heart disease, and stroke. The DASH eating plan may also help with weight loss.  What are tips for following this plan?    General guidelines   Avoid eating more than 2,300 mg (milligrams) of salt (sodium) a day. If you have hypertension, you may need to reduce your sodium intake to 1,500 mg a day.   Limit alcohol intake to no more than 1 drink a day for nonpregnant women and 2 drinks a day for men. One drink equals 12 oz of beer, 5 oz of wine, or 1 oz of hard liquor.   Work with your health care provider to maintain a healthy body weight or to lose weight. Ask what an ideal weight is for you.   Get at least 30 minutes of exercise that causes your heart to beat faster (aerobic exercise) most days of the week. Activities may include walking, swimming, or biking.   Work with your health care provider or diet and nutrition specialist (dietitian) to adjust your eating plan to your individual calorie needs.  Reading food labels     Check food labels for the amount of sodium per serving. Choose foods with less than 5 percent of the Daily Value of sodium. Generally, foods with less than 300 mg of sodium per serving fit into this eating plan.   To find whole grains, look for the word "whole" as the first word in the ingredient list.  Shopping   Buy products labeled as "low-sodium" or "no salt added."   Buy fresh foods. Avoid canned foods and premade or frozen meals.  Cooking   Avoid adding salt when cooking. Use salt-free seasonings or herbs instead of table salt or sea salt. Check with your health care provider or pharmacist before using salt substitutes.   Do not fry foods. Cook foods using healthy methods such as baking, boiling, grilling, and broiling instead.   Cook with  heart-healthy oils, such as olive, canola, soybean, or sunflower oil.  Meal planning   Eat a balanced diet that includes:  ? 5 or more servings of fruits and vegetables each day. At each meal, try to fill half of your plate with fruits and vegetables.  ? Up to 6-8 servings of whole grains each day.  ? Less than 6 oz of lean meat, poultry, or fish each day. A 3-oz serving of meat is about the same size as a deck of cards. One egg equals 1 oz.  ? 2 servings of low-fat dairy each day.  ? A serving of nuts, seeds, or beans 5 times each week.  ? Heart-healthy fats. Healthy fats called Omega-3 fatty acids are found in foods such as flaxseeds and coldwater fish, like sardines, salmon, and mackerel.   Limit how much you eat of the following:  ? Canned or prepackaged foods.  ? Food that is high in trans fat, such as fried foods.  ? Food that is high in saturated fat, such as fatty meat.  ? Sweets, desserts, sugary drinks, and other foods with added sugar.  ? Full-fat dairy products.   Do not salt foods before eating.   Try to eat at least 2 vegetarian meals each week.   Eat more home-cooked food and less restaurant, buffet, and fast food.     When eating at a restaurant, ask that your food be prepared with less salt or no salt, if possible.  What foods are recommended?  The items listed may not be a complete list. Talk with your dietitian about what dietary choices are best for you.  Grains  Whole-grain or whole-wheat bread. Whole-grain or whole-wheat pasta. Brown rice. Oatmeal. Quinoa. Bulgur. Whole-grain and low-sodium cereals. Pita bread. Low-fat, low-sodium crackers. Whole-wheat flour tortillas.  Vegetables  Fresh or frozen vegetables (raw, steamed, roasted, or grilled). Low-sodium or reduced-sodium tomato and vegetable juice. Low-sodium or reduced-sodium tomato sauce and tomato paste. Low-sodium or reduced-sodium canned vegetables.  Fruits  All fresh, dried, or frozen fruit. Canned fruit in natural juice (without  added sugar).  Meat and other protein foods  Skinless chicken or turkey. Ground chicken or turkey. Pork with fat trimmed off. Fish and seafood. Egg whites. Dried beans, peas, or lentils. Unsalted nuts, nut butters, and seeds. Unsalted canned beans. Lean cuts of beef with fat trimmed off. Low-sodium, lean deli meat.  Dairy  Low-fat (1%) or fat-free (skim) milk. Fat-free, low-fat, or reduced-fat cheeses. Nonfat, low-sodium ricotta or cottage cheese. Low-fat or nonfat yogurt. Low-fat, low-sodium cheese.  Fats and oils  Soft margarine without trans fats. Vegetable oil. Low-fat, reduced-fat, or light mayonnaise and salad dressings (reduced-sodium). Canola, safflower, olive, soybean, and sunflower oils. Avocado.  Seasoning and other foods  Herbs. Spices. Seasoning mixes without salt. Unsalted popcorn and pretzels. Fat-free sweets.  What foods are not recommended?  The items listed may not be a complete list. Talk with your dietitian about what dietary choices are best for you.  Grains  Baked goods made with fat, such as croissants, muffins, or some breads. Dry pasta or rice meal packs.  Vegetables  Creamed or fried vegetables. Vegetables in a cheese sauce. Regular canned vegetables (not low-sodium or reduced-sodium). Regular canned tomato sauce and paste (not low-sodium or reduced-sodium). Regular tomato and vegetable juice (not low-sodium or reduced-sodium). Pickles. Olives.  Fruits  Canned fruit in a light or heavy syrup. Fried fruit. Fruit in cream or butter sauce.  Meat and other protein foods  Fatty cuts of meat. Ribs. Fried meat. Bacon. Sausage. Bologna and other processed lunch meats. Salami. Fatback. Hotdogs. Bratwurst. Salted nuts and seeds. Canned beans with added salt. Canned or smoked fish. Whole eggs or egg yolks. Chicken or turkey with skin.  Dairy  Whole or 2% milk, cream, and half-and-half. Whole or full-fat cream cheese. Whole-fat or sweetened yogurt. Full-fat cheese. Nondairy creamers. Whipped toppings.  Processed cheese and cheese spreads.  Fats and oils  Butter. Stick margarine. Lard. Shortening. Ghee. Bacon fat. Tropical oils, such as coconut, palm kernel, or palm oil.  Seasoning and other foods  Salted popcorn and pretzels. Onion salt, garlic salt, seasoned salt, table salt, and sea salt. Worcestershire sauce. Tartar sauce. Barbecue sauce. Teriyaki sauce. Soy sauce, including reduced-sodium. Steak sauce. Canned and packaged gravies. Fish sauce. Oyster sauce. Cocktail sauce. Horseradish that you find on the shelf. Ketchup. Mustard. Meat flavorings and tenderizers. Bouillon cubes. Hot sauce and Tabasco sauce. Premade or packaged marinades. Premade or packaged taco seasonings. Relishes. Regular salad dressings.  Where to find more information:   National Heart, Lung, and Blood Institute: www.nhlbi.nih.gov   American Heart Association: www.heart.org  Summary   The DASH eating plan is a healthy eating plan that has been shown to reduce high blood pressure (hypertension). It may also reduce your risk for type 2 diabetes, heart disease, and stroke.   With the   DASH eating plan, you should limit salt (sodium) intake to 2,300 mg a day. If you have hypertension, you may need to reduce your sodium intake to 1,500 mg a day.   When on the DASH eating plan, aim to eat more fresh fruits and vegetables, whole grains, lean proteins, low-fat dairy, and heart-healthy fats.   Work with your health care provider or diet and nutrition specialist (dietitian) to adjust your eating plan to your individual calorie needs.  This information is not intended to replace advice given to you by your health care provider. Make sure you discuss any questions you have with your health care provider.  Document Released: 07/26/2011 Document Revised: 07/30/2016 Document Reviewed: 07/30/2016  Elsevier Interactive Patient Education  2019 [employer]

## 2018-10-15 LAB — BASIC METABOLIC PANEL
BUN/Creatinine Ratio: 21 (ref 9–23)
BUN: 13 mg/dL (ref 6–24)
CO2: 22 mmol/L (ref 20–29)
Calcium: 9.3 mg/dL (ref 8.7–10.2)
Chloride: 103 mmol/L (ref 96–106)
Creatinine, Ser: 0.63 mg/dL (ref 0.57–1.00)
GFR calc Af Amer: 118 mL/min/{1.73_m2} (ref >59)
GFR calc non Af Amer: 103 mL/min/{1.73_m2} (ref >59)
Glucose: 83 mg/dL (ref 65–99)
Potassium: 4.2 mmol/L (ref 3.5–5.2)
SODIUM: 143 mmol/L (ref 134–144)

## 2018-10-15 LAB — VITAMIN D 25 HYDROXY (VIT D DEFICIENCY, FRACTURES): Vit D, 25-Hydroxy: 63.6 ng/mL (ref 30.0–100.0)

## 2018-11-23 ENCOUNTER — Other Ambulatory Visit: Payer: Self-pay | Admitting: Nurse Practitioner

## 2018-12-30 ENCOUNTER — Other Ambulatory Visit: Payer: Self-pay | Admitting: Nurse Practitioner

## 2019-01-29 ENCOUNTER — Other Ambulatory Visit: Payer: Self-pay

## 2019-01-29 ENCOUNTER — Encounter: Payer: Self-pay | Admitting: Family Medicine

## 2019-01-29 ENCOUNTER — Ambulatory Visit: Payer: Managed Care, Other (non HMO) | Admitting: Family Medicine

## 2019-01-29 VITALS — BP 134/92 | HR 92 | Temp 98.1°F

## 2019-01-29 DIAGNOSIS — I10 Essential (primary) hypertension: Secondary | ICD-10-CM | POA: Diagnosis not present

## 2019-01-29 DIAGNOSIS — F419 Anxiety disorder, unspecified: Secondary | ICD-10-CM | POA: Diagnosis not present

## 2019-01-29 DIAGNOSIS — G250 Essential tremor: Secondary | ICD-10-CM

## 2019-01-29 MED ORDER — LISINOPRIL 20 MG PO TABS: 20.0000 mg | ORAL_TABLET | Freq: Every day | ORAL | 0 refills | Status: DC

## 2019-01-29 MED ORDER — BUSPIRONE HCL 10 MG PO TABS: 10.0000 mg | ORAL_TABLET | Freq: Three times a day (TID) | ORAL | 0 refills | Status: DC | PRN

## 2019-01-29 NOTE — Patient Instructions (Signed)

## 2019-01-29 NOTE — Progress Notes (Signed)
BP (!) 134/92   Pulse 92   Temp 98.1 F (36.7 C) (Oral)   SpO2 99%    Subjective:    Patient ID: Kathy Thompson, female    DOB: [birth year], 53 y.o.   MRN: [id number]  HPI: Kathy Thompson is a 53 y.o. female  Chief Complaint  Patient presents with  . Shaking    L arm x several months. Delay in movement. Some pain w/ range of motion.    3-4 months of anxiety at certain times of the day, unsure if there's a trigger. Has never really had an issue with this in the past at least to this extent. No panic episodes, SOB, CP, numbness or tingling. Notes a mild tremor of intention in the left hand the past month that seems worse when she's anxious.   Was recently put on 10 mg lisinopril, has not been checking home BP readings lately but previously readings were labile and sometimes higher than normal. Denies HAs, dizziness, CP, SOB.   Depression screen [facility] 2/9 01/29/2019 08/25/2018  Decreased Interest 0 0  Down, Depressed, Hopeless 0 0  PHQ - 2 Score 0 0  Altered sleeping 0 0  Tired, decreased energy 1 1  Change in appetite 0 0  Feeling bad or failure about yourself  0 0  Trouble concentrating 0 0  Moving slowly or fidgety/restless 0 0  Suicidal thoughts 0 0  PHQ-9 Score 1 1  Difficult doing work/chores - Not difficult at all   GAD 7 : Generalized Anxiety Score 08/25/2018  Nervous, Anxious, on Edge 1  Control/stop worrying 0  Worry too much - different things 0  Trouble relaxing 0  Restless 0  Easily annoyed or irritable 0  Afraid - awful might happen 0  Total GAD 7 Score 1  Anxiety Difficulty Not difficult at all   Relevant past medical, surgical, family and social history reviewed and updated as indicated. Interim medical history since our last visit reviewed. Allergies and medications reviewed and updated.  Review of Systems  Per HPI unless specifically indicated above     Objective:    BP (!) 134/92   Pulse 92   Temp 98.1 F (36.7 C) (Oral)   SpO2 99%   Wt  Readings from Last 3 Encounters:  10/14/18 152 lb (68.9 kg)  09/03/18 151 lb (68.5 kg)  08/25/18 151 lb (68.5 kg)    Physical Exam Vitals signs and nursing note reviewed.  Constitutional:      Appearance: Normal appearance. She is not ill-appearing.  HENT:     Head: Atraumatic.  Eyes:     Extraocular Movements: Extraocular movements intact.     Conjunctiva/sclera: Conjunctivae normal.  Neck:     Musculoskeletal: Normal range of motion and neck supple.  Cardiovascular:     Rate and Rhythm: Normal rate and regular rhythm.     Heart sounds: Normal heart sounds.  Pulmonary:     Effort: Pulmonary effort is normal.     Breath sounds: Normal breath sounds.  Musculoskeletal: Normal range of motion.  Skin:    General: Skin is warm and dry.  Neurological:     Mental Status: She is alert and oriented to person, place, and time.     Cranial Nerves: No cranial nerve deficit.     Sensory: No sensory deficit.     Motor: No weakness.     Gait: Gait normal.     Comments: Left hand tremor with intention, very minimal  Psychiatric:        Mood and Affect: Mood normal.        Thought Content: Thought content normal.        Judgment: Judgment normal.     Results for orders placed or performed in visit on 10/14/18  VITAMIN D 25 Hydroxy (Vit-D Deficiency, Fractures)  Result Value Ref Range   Vit D, 25-Hydroxy 63.6 30.0 - 100.0 ng/mL  Basic Metabolic Panel (BMET)  Result Value Ref Range   Glucose 83 65 - 99 mg/dL   BUN 13 6 - 24 mg/dL   Creatinine, Ser 5.640.63 0.57 - 1.00 mg/dL   GFR calc non Af Amer 103 >59 mL/min/1.73   GFR calc Af Amer 118 >59 mL/min/1.73   BUN/Creatinine Ratio 21 9 - 23   Sodium 143 134 - 144 mmol/L   Potassium 4.2 3.5 - 5.2 mmol/L   Chloride 103 96 - 106 mmol/L   CO2 22 20 - 29 mmol/L   Calcium 9.3 8.7 - 10.2 mg/dL      Assessment & Plan:   Problem List Items Addressed This Visit      Cardiovascular and Mediastinum   Essential hypertension - Primary     Increase lisinopril to 20 mg given persistent elevation in readings. Monitor BPs at home and f/u in 2 weeks for recheck. Call with abnormal readings or side effects in meantime      Relevant Medications   lisinopril (ZESTRIL) 20 MG tablet     Other   Anxiety    Agreeable to trying buspar on an as needed basis. Discussed taking at maximum 3 times daily. Recommended counseling but she declines at this time. Recheck in 2 weeks. Risks and benefits reviewed      Relevant Medications   busPIRone (BUSPAR) 10 MG tablet    Other Visit Diagnoses    Benign essential tremor       Suspect triggered by anxiety. Will continue to monitor as we work on better control of those sxs. Reassurance given       Follow up plan: Return in about 2 weeks (around 02/12/2019) for BP, tremor.

## 2019-02-02 NOTE — Assessment & Plan Note (Signed)
Agreeable to trying buspar on an as needed basis. Discussed taking at maximum 3 times daily. Recommended counseling but she declines at this time. Recheck in 2 weeks. Risks and benefits reviewed

## 2019-02-02 NOTE — Assessment & Plan Note (Signed)
Increase lisinopril to 20 mg given persistent elevation in readings. Monitor BPs at home and f/u in 2 weeks for recheck. Call with abnormal readings or side effects in meantime

## 2019-02-13 ENCOUNTER — Ambulatory Visit: Payer: Managed Care, Other (non HMO) | Admitting: Family Medicine

## 2019-02-20 ENCOUNTER — Other Ambulatory Visit: Payer: Self-pay | Admitting: Family Medicine

## 2019-02-20 NOTE — Telephone Encounter (Signed)
Requested Prescriptions  Pending Prescriptions Disp Refills  . lisinopril (ZESTRIL) 20 MG tablet [Pharmacy Med Name: LISINOPRIL 20 MG TABLET] 30 tablet 0    Sig: TAKE 1 TABLET BY MOUTH EVERY DAY     Cardiovascular:  ACE Inhibitors Failed - 02/20/2019 12:35 PM      Failed - Last BP in normal range    BP Readings from Last 1 Encounters:  01/29/19 (!) 134/92         Passed - Cr in normal range and within 180 days    Creatinine, Ser  Date Value Ref Range Status  10/14/2018 0.63 0.57 - 1.00 mg/dL Final         Passed - K in normal range and within 180 days    Potassium  Date Value Ref Range Status  10/14/2018 4.2 3.5 - 5.2 mmol/L Final         Passed - Patient is not pregnant      Passed - Valid encounter within last 6 months    Recent Outpatient Visits          3 weeks ago Essential hypertension   [facility] [employer], [place]   4 months ago Essential hypertension   [place], Monserrate T, NP   5 months ago Essential hypertension   [place], Haviland T, NP   5 months ago Elevated BP without diagnosis of hypertension   [place], Barbaraann Faster, NP      Future Appointments            In 1 week Orene Desanctis, Lilia Argue, [place], [place]   In 1 month C[place], Barbaraann Faster, NP [employer], [facility]

## 2019-02-27 ENCOUNTER — Ambulatory Visit: Payer: Managed Care, Other (non HMO) | Admitting: Family Medicine

## 2019-03-20 ENCOUNTER — Other Ambulatory Visit: Payer: Self-pay | Admitting: Neurology

## 2019-03-20 ENCOUNTER — Other Ambulatory Visit (HOSPITAL_COMMUNITY): Payer: Self-pay | Admitting: Neurology

## 2019-03-20 DIAGNOSIS — R251 Tremor, unspecified: Secondary | ICD-10-CM

## 2019-04-03 ENCOUNTER — Other Ambulatory Visit: Payer: Self-pay

## 2019-04-03 ENCOUNTER — Ambulatory Visit (HOSPITAL_COMMUNITY)
Admission: RE | Admit: 2019-04-03 | Discharge: 2019-04-03 | Disposition: A | Discharge status: Home / Self Care | Payer: Managed Care, Other (non HMO) | Source: Ambulatory Visit | Attending: Neurology | Admitting: Neurology

## 2019-04-03 DIAGNOSIS — R251 Tremor, unspecified: Secondary | ICD-10-CM

## 2019-04-14 ENCOUNTER — Ambulatory Visit: Payer: Managed Care, Other (non HMO) | Admitting: Nurse Practitioner

## 2019-05-21 ENCOUNTER — Ambulatory Visit: Payer: Managed Care, Other (non HMO) | Admitting: Nurse Practitioner

## 2019-05-24 ENCOUNTER — Encounter: Payer: Self-pay | Admitting: Nurse Practitioner

## 2019-05-24 DIAGNOSIS — G20 Parkinson's disease: Secondary | ICD-10-CM | POA: Insufficient documentation

## 2019-05-25 ENCOUNTER — Ambulatory Visit: Payer: Managed Care, Other (non HMO) | Admitting: Nurse Practitioner

## 2019-05-25 ENCOUNTER — Other Ambulatory Visit: Payer: Self-pay

## 2019-05-25 ENCOUNTER — Encounter: Payer: Self-pay | Admitting: Nurse Practitioner

## 2019-05-25 VITALS — BP 140/82 | HR 97 | Temp 98.8°F

## 2019-05-25 DIAGNOSIS — G20 Parkinson's disease: Secondary | ICD-10-CM | POA: Diagnosis not present

## 2019-05-25 DIAGNOSIS — E559 Vitamin D deficiency, unspecified: Secondary | ICD-10-CM

## 2019-05-25 DIAGNOSIS — I10 Essential (primary) hypertension: Secondary | ICD-10-CM | POA: Diagnosis not present

## 2019-05-25 MED ORDER — LISINOPRIL 20 MG PO TABS: 20.0000 mg | ORAL_TABLET | Freq: Every day | ORAL | 0 refills | Status: DC

## 2019-05-25 NOTE — Assessment & Plan Note (Signed)
Chronic, ongoing.  Restart Lisinopril 20 MG, script sent.  Recommend checking BP at home daily in morning and documenting.  Labs today CMP.  Return in 4 weeks.

## 2019-05-25 NOTE — Progress Notes (Signed)
BP 140/82 (BP Location: Left Arm, Patient Position: Sitting)   Pulse 97   Temp 98.8 F (37.1 C) (Oral)   SpO2 98%    Subjective:    Patient ID: Kathy Thompson, female    DOB: 1966-03-02, 53 y.o.   MRN: [id number]  HPI: Kathy Thompson is a 53 y.o. female  Chief Complaint  Patient presents with  . Hypertension  . Parkinson's   HYPERTENSION Lisinopril increased to 20 MG in June, but states only one prescription was called in of 20 MG, so has been taking 10 MG. Hypertension status: stable  Satisfied with current treatment? yes Duration of hypertension: chronic BP monitoring frequency:  rarely BP range:  BP medication side effects:  no Medication compliance: good compliance Aspirin: no Recurrent headaches: no Visual changes: no Palpitations: no Dyspnea: no Chest pain: no Lower extremity edema: no Dizzy/lightheaded: no    PARKINSON'S DISEASE: New diagnosis, seeing Dr. Manuella Ghazi and started on Sinemet 1 tablet TID, which she reports had helped left sided tremor but continues to have some left shoulder discomfort from it.  She has PT referral in from neurology, discussed this with her. Started with tremors 3 months ago, on left side.  States dermatology referred her to neurology, as patient reports being concerned it was her Humira.  Educated at length on disease process today, patient very tearful about new diagnosis and concerns about progression.    VITAMIN D DEFICIENCY: Continues on daily supplement. Denies any recent falls or fractures.    Relevant past medical, surgical, family and social history reviewed and updated as indicated. Interim medical history since our last visit reviewed. Allergies and medications reviewed and updated.  Review of Systems  Constitutional: Negative for activity change, appetite change, diaphoresis, fatigue and fever.  Respiratory: Negative for cough, chest tightness and shortness of breath.   Cardiovascular: Negative for chest pain,  palpitations and leg swelling.  Gastrointestinal: Negative for abdominal distention, abdominal pain, constipation, diarrhea, nausea and vomiting.  Neurological: Positive for tremors. Negative for dizziness, syncope, weakness, light-headedness, numbness and headaches.  Psychiatric/Behavioral: Negative.     Per HPI unless specifically indicated above     Objective:    BP 140/82 (BP Location: Left Arm, Patient Position: Sitting)   Pulse 97   Temp 98.8 F (37.1 C) (Oral)   SpO2 98%   Wt Readings from Last 3 Encounters:  10/14/18 152 lb (68.9 kg)  09/03/18 151 lb (68.5 kg)  08/25/18 151 lb (68.5 kg)    Physical Exam Vitals signs and nursing note reviewed.  Constitutional:      General: She is awake. She is not in acute distress.    Appearance: She is well-developed. She is not ill-appearing.  HENT:     Head: Normocephalic.     Right Ear: Hearing normal.     Left Ear: Hearing normal.  Eyes:     General: Lids are normal.        Right eye: No discharge.        Left eye: No discharge.     Conjunctiva/sclera: Conjunctivae normal.     Pupils: Pupils are equal, round, and reactive to light.  Neck:     Musculoskeletal: Normal range of motion and neck supple.     Vascular: No carotid bruit.  Cardiovascular:     Rate and Rhythm: Normal rate and regular rhythm.     Heart sounds: Normal heart sounds. No murmur. No gallop.   Pulmonary:     Effort: Pulmonary effort  is normal. No accessory muscle usage or respiratory distress.     Breath sounds: Normal breath sounds.  Abdominal:     General: Bowel sounds are normal.     Palpations: Abdomen is soft.  Musculoskeletal:     Right lower leg: No edema.     Left lower leg: No edema.  Skin:    General: Skin is warm and dry.  Neurological:     Mental Status: She is alert and oriented to person, place, and time.     Deep Tendon Reflexes:     Reflex Scores:      Brachioradialis reflexes are 1+ on the right side and 1+ on the left side.       Patellar reflexes are 1+ on the right side and 1+ on the left side.    Comments: Cogwheel noted L>R.  Decreased eye blinking noted.  Increased tone bilateral upper extremities.  Tremor LUE.  Psychiatric:        Attention and Perception: Attention normal.        Mood and Affect: Mood normal.        Behavior: Behavior normal. Behavior is cooperative.        Thought Content: Thought content normal.        Judgment: Judgment normal.     Results for orders placed or performed in visit on 10/14/18  VITAMIN D 25 Hydroxy (Vit-D Deficiency, Fractures)  Result Value Ref Range   Vit D, 25-Hydroxy 63.6 30.0 - 100.0 ng/mL  Basic Metabolic Panel (BMET)  Result Value Ref Range   Glucose 83 65 - 99 mg/dL   BUN 13 6 - 24 mg/dL   Creatinine, Ser 1.88 0.57 - 1.00 mg/dL   GFR calc non Af Amer 103 >59 mL/min/1.73   GFR calc Af Amer 118 >59 mL/min/1.73   BUN/Creatinine Ratio 21 9 - 23   Sodium 143 134 - 144 mmol/L   Potassium 4.2 3.5 - 5.2 mmol/L   Chloride 103 96 - 106 mmol/L   CO2 22 20 - 29 mmol/L   Calcium 9.3 8.7 - 10.2 mg/dL      Assessment & Plan:   Problem List Items Addressed This Visit      Cardiovascular and Mediastinum   Essential hypertension    Chronic, ongoing.  Restart Lisinopril 20 MG, script sent.  Recommend checking BP at home daily in morning and documenting.  Labs today CMP.  Return in 4 weeks.      Relevant Medications   lisinopril (ZESTRIL) 20 MG tablet   Other Relevant Orders   Comprehensive metabolic panel     Nervous and Auditory   Parkinson disease (HCC)    New diagnosis, patient very tearful.  Offered therapy or CCM referral, she declines at this time.  Continue collaboration with neurology and current Sinemet regimen + physical therapy (referral in place from neuro).  Provided lengthy education on disease process to patient and answered all questions + offered empathetic listening.      Relevant Medications   carbidopa-levodopa (SINEMET IR) 25-100 MG tablet      Other   Vitamin D deficiency - Primary    Continue supplement and recheck Vit D level today.      Relevant Orders   VITAMIN D 25 Hydroxy (Vit-D Deficiency, Fractures)     Time:  25 minutes, >50% spent counseling and educating on Parkinson's Disease    Follow up plan: Return in about 4 weeks (around 06/22/2019) for HTN follow-up.

## 2019-05-25 NOTE — Assessment & Plan Note (Signed)
New diagnosis, patient very tearful.  Offered therapy or CCM referral, she declines at this time.  Continue collaboration with neurology and current Sinemet regimen + physical therapy (referral in place from neuro).  Provided lengthy education on disease process to patient and answered all questions + offered empathetic listening.

## 2019-05-25 NOTE — Patient Instructions (Signed)
Parkinson's Disease Parkinson's disease is a type of movement disorder. It is a long-term condition that gets worse over time (is progressive). Each person with Parkinson's disease is affected differently. This condition limits your ability to control movements and move your body normally. The condition can range from mild to severe. Parkinson's disease tends to get worse slowly over several years. What are the causes? Parkinson's disease results from a loss of brain cells (neurons) that make a brain chemical called dopamine. Dopamine is needed to control movement. As the condition gets worse, neurons make less dopamine. This makes it hard to move or control your movements. The exact cause of the loss of neurons and why they make less dopamine is not known. Factors related to genes and the environment may contribute to the cause of Parkinson's disease. What increases the risk? The following factors may make you more likely to develop this condition:  Being female.  Being age 60 or older.  Having a family history of Parkinson's disease.  Having had a traumatic brain injury.  Having experienced depression.  Having been exposed to toxins, such as pesticides. What are the signs or symptoms? Symptoms of this condition can vary. The main symptoms are related to movement. These include:  A tremor or shaking while you are resting that you cannot control.  Stiffness in your neck, arms, and legs (rigidity).  Slowing of movement. You may lose facial expressions and have trouble making small movements that are needed to button clothing or brush your teeth.  An abnormal walk. You may walk with short, shuffling steps.  Loss of balance and stability when standing. You may sway, fall backward, and have trouble making turns. Other symptoms include:  Mental or cognitive changes including depression, anxiety, having false beliefs (delusions), or seeing, hearing, or feeling things that do not exist  (hallucinations).  Trouble speaking or swallowing.  Changes in bowel or bladder functions including constipation, having to go urgently or frequently, or not being able to control your bowel or bladder.  Changes in sleep habits or trouble sleeping. Parkinson's disease may be graded by severity of your condition as mild, moderate, or advanced. Parkinson's disease progression is different for everyone. You may not progress to the advanced stage.  Mild Parkinson's disease involves: ? Movement problems that do not affect daily activities. ? Movement problems on one side of the body.  Moderate Parkinson's disease involves: ? Movement problems on both sides of the body. ? Slowing of movement. ? Coordination and balance problems.  Advanced Parkinson's disease involves: ? Extreme difficulty walking. ? Inability to live alone safely. ? Signs of dementia, such as having trouble remembering things, doing daily tasks such as getting dressed, and problem solving. How is this diagnosed? This condition is diagnosed by a specialist. A diagnosis may be made based on symptoms, your medical history, and a physical exam. You may also have brain imaging tests to check for a loss of dopamine-producing areas of the brain. How is this treated? There is no cure for Parkinson's disease. Treatment focuses on managing your symptoms. Treatment may include:  Medicines. Everyone responds to medicines differently. Your response may change over time. Work with your health care provider to find the best medicines for you.  Speech, occupational, and physical therapy.  Deep brain stimulation surgery to reduce tremors and other involuntary movements. Follow these instructions at home: Medicines  Take over-the-counter and prescription medicines only as told by your health care provider.  Avoid taking medicines that can affect   thinking, such as pain or sleeping medicines. Eating and drinking  Follow instructions  from your health care provider about eating or drinking restrictions.  Do not drink alcohol. Activity  Talk with your health care provider about if it is safe for you to drive.  Do exercises as told by your health care provider or physical therapist. Lifestyle      Install grab bars and railings in your home to prevent falls.  Do not use any products that contain nicotine or tobacco, such as cigarettes, e-cigarettes, and chewing tobacco. If you need help quitting, ask your health care provider.  Consider joining a support group for people with Parkinson's disease. General instructions  Work with your health care provider to determine what you need help with and what your safety needs are.  Keep all follow-up visits as told by your health care provider, including any visits with a physical therapist, speech therapist, or occupational therapist. This is important. Contact a health care provider if:  Medicines do not help your symptoms.  You are unsteady or have fallen at home.  You need more support to function well at home.  You have trouble swallowing.  You have severe constipation.  You are having problems with side effects from your medicines.  You feel confused, anxious, or depressed. Get help right away if you:  Are injured after a fall.  See or hear things that are not real.  Cannot swallow without choking.  Have chest pain or trouble breathing.  Do not feel safe at home.  Have thoughts about hurting yourself or others. If you ever feel like you may hurt yourself or others, or have thoughts about taking your own life, get help right away. You can go to your nearest emergency department or call:  Your local emergency services (911 in the [place]  A suicide crisis helpline, such as the National Suicide Prevention Lifeline at 1-800-273-8255. This is open 24 hours a day. Summary  Parkinson's disease is a long-term condition that gets worse over time. This  condition limits your ability to control your movements and move your body normally.  There is no cure for Parkinson's disease. Treatment focuses on managing your symptoms.  Work with your health care provider to determine what you need help with and what your safety needs are.  Keep all follow-up visits as told by your health care provider, including any visits with a physical therapist, speech therapist, or occupational therapist. This is important. This information is not intended to replace advice given to you by your health care provider. Make sure you discuss any questions you have with your health care provider. Document Released: 08/03/2000 Document Revised: 10/23/2018 Document Reviewed: 10/23/2018 Elsevier Patient Education  2020 [employer]  

## 2019-05-25 NOTE — Assessment & Plan Note (Signed)
Continue supplement and recheck Vit D level today. 

## 2019-05-26 ENCOUNTER — Other Ambulatory Visit: Payer: Self-pay | Admitting: Nurse Practitioner

## 2019-05-26 LAB — COMPREHENSIVE METABOLIC PANEL
ALT: 8 IU/L (ref 0–32)
AST: 11 IU/L (ref 0–40)
Albumin/Globulin Ratio: 2.1 (ref 1.2–2.2)
Albumin: 4.6 g/dL (ref 3.8–4.9)
Alkaline Phosphatase: 56 IU/L (ref 39–117)
BUN/Creatinine Ratio: 28 — ABNORMAL HIGH (ref 9–23)
BUN: 17 mg/dL (ref 6–24)
Bilirubin Total: 0.4 mg/dL (ref 0.0–1.2)
CO2: 23 mmol/L (ref 20–29)
Calcium: 9.3 mg/dL (ref 8.7–10.2)
Chloride: 104 mmol/L (ref 96–106)
Creatinine, Ser: 0.61 mg/dL (ref 0.57–1.00)
GFR calc Af Amer: 119 mL/min/{1.73_m2} (ref >59)
GFR calc non Af Amer: 103 mL/min/{1.73_m2} (ref >59)
Globulin, Total: 2.2 g/dL (ref 1.5–4.5)
Glucose: 92 mg/dL (ref 65–99)
Potassium: 3.8 mmol/L (ref 3.5–5.2)
Sodium: 140 mmol/L (ref 134–144)
Total Protein: 6.8 g/dL (ref 6.0–8.5)

## 2019-05-26 LAB — VITAMIN D 25 HYDROXY (VIT D DEFICIENCY, FRACTURES): Vit D, 25-Hydroxy: 29.7 ng/mL — ABNORMAL LOW (ref 30.0–100.0)

## 2019-05-26 MED ORDER — VITAMIN D3 1.25 MG (50000 UT) PO CAPS: ORAL_CAPSULE | ORAL | 1 refills | Status: DC

## 2019-06-16 ENCOUNTER — Other Ambulatory Visit: Payer: Self-pay | Admitting: Nurse Practitioner

## 2019-06-23 ENCOUNTER — Ambulatory Visit: Payer: Managed Care, Other (non HMO) | Admitting: Nurse Practitioner

## 2019-07-17 ENCOUNTER — Other Ambulatory Visit: Payer: Self-pay | Admitting: Nurse Practitioner

## 2019-08-14 ENCOUNTER — Other Ambulatory Visit: Payer: Self-pay | Admitting: Nurse Practitioner

## 2019-08-16 NOTE — Telephone Encounter (Signed)
Forwarding medication refill request to PCP for review. 

## 2019-11-05 ENCOUNTER — Other Ambulatory Visit: Payer: Self-pay | Admitting: Nurse Practitioner

## 2019-11-05 NOTE — Telephone Encounter (Signed)
Patient last seen 05/25/19.

## 2019-11-05 NOTE — Telephone Encounter (Signed)
Requested  medications are  due for refill today yes  Requested medications are on the active medication list yes  Last refill 12/26  Notes to clinic: 50,000 units not delegated

## 2019-11-05 NOTE — Telephone Encounter (Signed)
Called pt to schedule appt, no answer, left vm to call back  °

## 2020-07-19 ENCOUNTER — Other Ambulatory Visit: Payer: Self-pay

## 2020-07-19 ENCOUNTER — Other Ambulatory Visit: Payer: Managed Care, Other (non HMO)

## 2020-07-19 DIAGNOSIS — L400 Psoriasis vulgaris: Secondary | ICD-10-CM

## 2020-07-21 LAB — HEPATITIS B SURFACE ANTIGEN: Hepatitis B Surface Ag: NEGATIVE

## 2020-07-21 LAB — HEPATITIS B CORE ANTIBODY, TOTAL: Hep B Core Total Ab: NEGATIVE

## 2020-07-21 LAB — QUANTIFERON-TB GOLD PLUS
QuantiFERON Mitogen Value: >10 IU/mL
QuantiFERON Nil Value: 0.01 IU/mL
QuantiFERON TB1 Ag Value: 0.01 IU/mL
QuantiFERON TB2 Ag Value: 0.01 IU/mL
QuantiFERON-TB Gold Plus: NEGATIVE

## 2020-07-21 LAB — HEPATITIS B SURFACE ANTIBODY, QUANTITATIVE: Hepatitis B Surf Ab Quant: 3.6 m[IU]/mL — ABNORMAL LOW (ref >9.9)

## 2020-07-21 LAB — HEPATITIS C ANTIBODY: Hep C Virus Ab: 0.5 s/co ratio (ref 0.0–0.9)

## 2020-08-10 ENCOUNTER — Other Ambulatory Visit: Payer: Self-pay | Admitting: Nurse Practitioner

## 2020-08-10 NOTE — Telephone Encounter (Signed)
[facility]esy refill. Left message to make appointment. 

## 2020-09-13 ENCOUNTER — Other Ambulatory Visit: Payer: Self-pay | Admitting: Nurse Practitioner

## 2020-10-18 ENCOUNTER — Other Ambulatory Visit: Payer: Self-pay | Admitting: Nurse Practitioner

## 2020-10-18 NOTE — Telephone Encounter (Signed)
[facility] refill . Future appt in 2 weeks

## 2020-11-01 ENCOUNTER — Ambulatory Visit: Payer: Managed Care, Other (non HMO) | Admitting: Nurse Practitioner

## 2020-11-01 ENCOUNTER — Other Ambulatory Visit: Payer: Self-pay

## 2020-11-01 ENCOUNTER — Encounter: Payer: Self-pay | Admitting: Nurse Practitioner

## 2020-11-01 VITALS — BP 117/76 | HR 86 | Temp 98.5°F | Wt 157.0 lb

## 2020-11-01 DIAGNOSIS — E559 Vitamin D deficiency, unspecified: Secondary | ICD-10-CM

## 2020-11-01 DIAGNOSIS — I10 Essential (primary) hypertension: Secondary | ICD-10-CM | POA: Diagnosis not present

## 2020-11-01 DIAGNOSIS — G20 Parkinson's disease: Secondary | ICD-10-CM | POA: Diagnosis not present

## 2020-11-01 MED ORDER — LISINOPRIL 20 MG PO TABS
20.0000 mg | ORAL_TABLET | Freq: Every day | ORAL | 4 refills | Status: DC
Start: 2020-11-01 — End: 2021-06-07

## 2020-11-01 NOTE — Progress Notes (Signed)
BP 117/76   Pulse 86   Temp 98.5 F (36.9 C) (Oral)   Wt 157 lb (71.2 kg)   SpO2 98%   BMI 24.64 kg/m    Subjective:    Patient ID: Kathy Thompson, female    DOB: 1966-06-12, 55 y.o.   MRN: [id number]  HPI: Kathy Thompson is a 55 y.o. female  Chief Complaint  Patient presents with  . Hypertension    Patient denies having any problems or concerns at today's visit.   HYPERTENSION Continues on Lisinopril 20 MG daily. Hypertension status: stable  Satisfied with current treatment? yes Duration of hypertension: chronic BP monitoring frequency:  rarely BP range:  BP medication side effects:  no Medication compliance: good compliance Aspirin: no Recurrent headaches: no Visual changes: no Palpitations: no Dyspnea: no Chest pain: no Lower extremity edema: no Dizzy/lightheaded: no     PARKINSON'S DISEASE: Followed by neurology and continues on Sinemet 1 tablet TID, tremor has improved with this.  Started with tremors 2020, on left side.  Continues to follow dermatology -- sees Dr. Roseanne Kaufman, last saw 4-5 months ago.  Tried Humira.    VITAMIN D DEFICIENCY: Not on daily supplement at this time. Denies any recent falls or fractures.    Depression screen [facility] 2/9 11/01/2020 01/29/2019 08/25/2018  Decreased Interest 0 0 0  Down, Depressed, Hopeless 0 0 0  PHQ - 2 Score 0 0 0  Altered sleeping 0 0 0  Tired, decreased energy 0 1 1  Change in appetite 0 0 0  Feeling bad or failure about yourself  0 0 0  Trouble concentrating 0 0 0  Moving slowly or fidgety/restless 0 0 0  Suicidal thoughts 0 0 0  PHQ-9 Score 0 1 1  Difficult doing work/chores - - Not difficult at all   Relevant past medical, surgical, family and social history reviewed and updated as indicated. Interim medical history since our last visit reviewed. Allergies and medications reviewed and updated.  Review of Systems  Constitutional: Negative for activity change, appetite change, diaphoresis, fatigue and  fever.  Respiratory: Negative for cough, chest tightness and shortness of breath.   Cardiovascular: Negative for chest pain, palpitations and leg swelling.  Gastrointestinal: Negative.   Neurological: Negative for dizziness, tremors, syncope, weakness, light-headedness, numbness and headaches.  Psychiatric/Behavioral: Negative.     Per HPI unless specifically indicated above     Objective:    BP 117/76   Pulse 86   Temp 98.5 F (36.9 C) (Oral)   Wt 157 lb (71.2 kg)   SpO2 98%   BMI 24.64 kg/m   Wt Readings from Last 3 Encounters:  11/01/20 157 lb (71.2 kg)  10/14/18 152 lb (68.9 kg)  09/03/18 151 lb (68.5 kg)    Physical Exam Vitals and nursing note reviewed.  Constitutional:      General: She is awake. She is not in acute distress.    Appearance: She is well-developed. She is not ill-appearing.  HENT:     Head: Normocephalic.     Right Ear: Hearing normal.     Left Ear: Hearing normal.  Eyes:     General: Lids are normal.        Right eye: No discharge.        Left eye: No discharge.     Conjunctiva/sclera: Conjunctivae normal.     Pupils: Pupils are equal, round, and reactive to light.  Neck:     Vascular: No carotid bruit.  Cardiovascular:  Rate and Rhythm: Normal rate and regular rhythm.     Heart sounds: Normal heart sounds. No murmur heard. No gallop.   Pulmonary:     Effort: Pulmonary effort is normal. No accessory muscle usage or respiratory distress.     Breath sounds: Normal breath sounds.  Abdominal:     General: Bowel sounds are normal.     Palpations: Abdomen is soft.  Musculoskeletal:     Cervical back: Normal range of motion and neck supple.     Right lower leg: No edema.     Left lower leg: No edema.  Skin:    General: Skin is warm and dry.  Neurological:     Mental Status: She is alert and oriented to person, place, and time.     Motor: No tremor.     Deep Tendon Reflexes:     Reflex Scores:      Brachioradialis reflexes are 1+ on  the right side and 1+ on the left side.      Patellar reflexes are 1+ on the right side and 1+ on the left side.    Comments: No Cogwheel noted today.  Decreased eye blinking noted.  No tremor present.  Psychiatric:        Attention and Perception: Attention normal.        Mood and Affect: Mood normal.        Behavior: Behavior normal. Behavior is cooperative.        Thought Content: Thought content normal.        Judgment: Judgment normal.    Results for orders placed or performed in visit on 07/19/20  QuantiFERON-TB Gold Plus  Result Value Ref Range   QuantiFERON Incubation Incubation performed.    QuantiFERON Criteria Comment    QuantiFERON TB1 Ag Value 0.01 IU/mL   QuantiFERON TB2 Ag Value 0.01 IU/mL   QuantiFERON Nil Value 0.01 IU/mL   QuantiFERON Mitogen Value >10.00 IU/mL   QuantiFERON-TB Gold Plus Negative Negative  Hepatitis B surface antibody,quantitative  Result Value Ref Range   Hepatitis B Surf Ab Quant 3.6 (L) Immunity>9.9 mIU/mL  Hepatitis C Antibody  Result Value Ref Range   Hep C Virus Ab 0.5 0.0 - 0.9 s/co ratio  Hepatitis B core antibody, total  Result Value Ref Range   Hep B Core Total Ab Negative Negative  Hepatitis B surface antigen  Result Value Ref Range   Hepatitis B Surface Ag Negative Negative      Assessment & Plan:   Problem List Items Addressed This Visit      Cardiovascular and Mediastinum   Essential hypertension    Chronic, ongoing.  Recommend she monitor BP at least a few mornings a week at home and document.  DASH diet at home.  Continue current medication regimen and adjust as needed, refills sent.  Labs today: CMP, TSH, lipid.  Return in 6 months.      Relevant Orders   Comprehensive metabolic panel   Lipid Panel w/o Chol/HDL Ratio   TSH     Nervous and Auditory   Parkinson disease (HCC) - Primary    Followed by neurology. Continue collaboration with neurology and current Sinemet regimen + physical therapy as needed.  Also  continue collaboration with dermatology.        Other   Vitamin D deficiency    May need to restart supplement and recheck Vit D level today.      Relevant Orders   VITAMIN D 25 Hydroxy (Vit-D Deficiency,  Fractures)        Follow up plan: Return in about 6 months (around 05/04/2021).

## 2020-11-01 NOTE — Assessment & Plan Note (Signed)
Followed by neurology. Continue collaboration with neurology and current Sinemet regimen + physical therapy as needed.  Also continue collaboration with dermatology.

## 2020-11-01 NOTE — Addendum Note (Signed)
Addended by: Aura Dials T on: 11/01/2020 12:25 PM   Modules accepted: Orders

## 2020-11-01 NOTE — Assessment & Plan Note (Signed)
Chronic, ongoing.  Recommend she monitor BP at least a few mornings a week at home and document.  DASH diet at home.  Continue current medication regimen and adjust as needed, refills sent.  Labs today: CMP, TSH, lipid.  Return in 6 months.

## 2020-11-01 NOTE — Assessment & Plan Note (Signed)
May need to restart supplement and recheck Vit D level today.

## 2020-11-01 NOTE — Patient Instructions (Signed)

## 2020-11-02 LAB — COMPREHENSIVE METABOLIC PANEL
ALT: 7 IU/L (ref 0–32)
AST: 17 IU/L (ref 0–40)
Albumin/Globulin Ratio: 2.3 — ABNORMAL HIGH (ref 1.2–2.2)
Albumin: 4.6 g/dL (ref 3.8–4.9)
Alkaline Phosphatase: 78 IU/L (ref 44–121)
BUN/Creatinine Ratio: 29 — ABNORMAL HIGH (ref 9–23)
BUN: 16 mg/dL (ref 6–24)
Bilirubin Total: 0.3 mg/dL (ref 0.0–1.2)
CO2: 25 mmol/L (ref 20–29)
Calcium: 9.2 mg/dL (ref 8.7–10.2)
Chloride: 101 mmol/L (ref 96–106)
Creatinine, Ser: 0.56 mg/dL — ABNORMAL LOW (ref 0.57–1.00)
Globulin, Total: 2 g/dL (ref 1.5–4.5)
Glucose: 98 mg/dL (ref 65–99)
Potassium: 4.1 mmol/L (ref 3.5–5.2)
Sodium: 140 mmol/L (ref 134–144)
Total Protein: 6.6 g/dL (ref 6.0–8.5)
eGFR: 108 mL/min/{1.73_m2} (ref >59)

## 2020-11-02 LAB — LIPID PANEL W/O CHOL/HDL RATIO
Cholesterol, Total: 193 mg/dL (ref 100–199)
HDL: 83 mg/dL (ref >39)
LDL Chol Calc (NIH): 102 mg/dL — ABNORMAL HIGH (ref 0–99)
Triglycerides: 39 mg/dL (ref 0–149)
VLDL Cholesterol Cal: 8 mg/dL (ref 5–40)

## 2020-11-02 LAB — VITAMIN D 25 HYDROXY (VIT D DEFICIENCY, FRACTURES): Vit D, 25-Hydroxy: 27.9 ng/mL — ABNORMAL LOW (ref 30.0–100.0)

## 2020-11-02 LAB — TSH: TSH: 0.76 u[IU]/mL (ref 0.450–4.500)

## 2020-11-02 NOTE — Progress Notes (Signed)
Good afternoon, please let Abbegayle know labs have returned.  Kidney and liver function remain stable.  Her LDL (bad cholesterol) remains a little elevated, but continued recommendation for diet focus.  Thyroid is normal.  Vitamin D remains a little on low side, I recommend she start taking Vitamin D3 2000 units daily for overall bone and muscle health and we will recheck next visit.  Any questions? Keep being awesome!!  Thank you for allowing me to participate in your care. Kindest regards, Nimesh Riolo

## 2021-06-07 ENCOUNTER — Other Ambulatory Visit: Payer: Self-pay

## 2021-06-07 MED ORDER — LISINOPRIL 20 MG PO TABS: 20.0000 mg | ORAL_TABLET | Freq: Every day | ORAL | 2 refills | Status: DC

## 2021-07-30 DIAGNOSIS — E538 Deficiency of other specified B group vitamins: Secondary | ICD-10-CM | POA: Insufficient documentation

## 2021-07-30 DIAGNOSIS — E7800 Pure hypercholesterolemia, unspecified: Secondary | ICD-10-CM | POA: Insufficient documentation

## 2021-08-03 ENCOUNTER — Ambulatory Visit: Payer: Managed Care, Other (non HMO) | Admitting: Nurse Practitioner

## 2021-08-03 DIAGNOSIS — I10 Essential (primary) hypertension: Secondary | ICD-10-CM

## 2021-08-03 DIAGNOSIS — E538 Deficiency of other specified B group vitamins: Secondary | ICD-10-CM

## 2021-08-03 DIAGNOSIS — G20 Parkinson's disease: Secondary | ICD-10-CM

## 2021-08-03 DIAGNOSIS — E7800 Pure hypercholesterolemia, unspecified: Secondary | ICD-10-CM

## 2021-08-03 DIAGNOSIS — E559 Vitamin D deficiency, unspecified: Secondary | ICD-10-CM

## 2021-10-27 ENCOUNTER — Telehealth: Payer: Self-pay

## 2021-10-27 NOTE — Telephone Encounter (Signed)
This has already been addressed in her father n laws chart and sent to scheduling. Please call to set up appt.  ?

## 2021-10-27 NOTE — Telephone Encounter (Signed)
Agree. Thanks

## 2021-10-27 NOTE — Telephone Encounter (Signed)
Pt would like to make an establish care appt with you. Her father in law is Aylla Huffine and is a pt of yours. Advika was told by Gerlene Burdock that you were good to see pt.  ?Please advise, so pt can get scheduled.  ?

## 2021-11-16 NOTE — Telephone Encounter (Signed)
Patient scheduled for 11/20/21 with Dr. Para March ?

## 2021-11-20 ENCOUNTER — Telehealth: Payer: Self-pay | Admitting: *Deleted

## 2021-11-20 ENCOUNTER — Ambulatory Visit: Payer: Managed Care, Other (non HMO) | Admitting: Family Medicine

## 2021-11-20 ENCOUNTER — Encounter: Payer: Self-pay | Admitting: Family Medicine

## 2021-11-20 VITALS — BP 124/78 | HR 94 | Temp 98.0°F | Ht 66.15 in | Wt 161.0 lb

## 2021-11-20 DIAGNOSIS — Z7189 Other specified counseling: Secondary | ICD-10-CM | POA: Diagnosis not present

## 2021-11-20 DIAGNOSIS — G20 Parkinson's disease: Secondary | ICD-10-CM

## 2021-11-20 DIAGNOSIS — I10 Essential (primary) hypertension: Secondary | ICD-10-CM | POA: Diagnosis not present

## 2021-11-20 DIAGNOSIS — L409 Psoriasis, unspecified: Secondary | ICD-10-CM

## 2021-11-20 DIAGNOSIS — Z1211 Encounter for screening for malignant neoplasm of colon: Secondary | ICD-10-CM | POA: Diagnosis not present

## 2021-11-20 DIAGNOSIS — E559 Vitamin D deficiency, unspecified: Secondary | ICD-10-CM

## 2021-11-20 LAB — COMPREHENSIVE METABOLIC PANEL
ALT: 4 U/L (ref 0–35)
AST: 16 U/L (ref 0–37)
Albumin: 4.4 g/dL (ref 3.5–5.2)
Alkaline Phosphatase: 87 U/L (ref 39–117)
BUN: 16 mg/dL (ref 6–23)
CO2: 30 mEq/L (ref 19–32)
Calcium: 9.3 mg/dL (ref 8.4–10.5)
Chloride: 102 mEq/L (ref 96–112)
Creatinine, Ser: 0.53 mg/dL (ref 0.40–1.20)
GFR: 103.26 mL/min (ref >60.00)
Glucose, Bld: 87 mg/dL (ref 70–99)
Potassium: 3.6 mEq/L (ref 3.5–5.1)
Sodium: 138 mEq/L (ref 135–145)
Total Bilirubin: 0.4 mg/dL (ref 0.2–1.2)
Total Protein: 6.8 g/dL (ref 6.0–8.3)

## 2021-11-20 LAB — LIPID PANEL
Cholesterol: 168 mg/dL (ref 0–200)
HDL: 72.4 mg/dL (ref >39.00)
LDL Cholesterol: 83 mg/dL (ref 0–99)
NonHDL: 95.13
Total CHOL/HDL Ratio: 2
Triglycerides: 60 mg/dL (ref 0.0–149.0)
VLDL: 12 mg/dL (ref 0.0–40.0)

## 2021-11-20 LAB — CBC WITH DIFFERENTIAL/PLATELET
Basophils Absolute: 0 10*3/uL (ref 0.0–0.1)
Basophils Relative: 0.8 % (ref 0.0–3.0)
Eosinophils Absolute: 0.1 10*3/uL (ref 0.0–0.7)
Eosinophils Relative: 3.2 % (ref 0.0–5.0)
HCT: 38.2 % (ref 36.0–46.0)
Hemoglobin: 13.3 g/dL (ref 12.0–15.0)
Lymphocytes Relative: 28.8 % (ref 12.0–46.0)
Lymphs Abs: 1.3 10*3/uL (ref 0.7–4.0)
MCHC: 34.7 g/dL (ref 30.0–36.0)
MCV: 92.3 fl (ref 78.0–100.0)
Monocytes Absolute: 0.9 10*3/uL (ref 0.1–1.0)
Monocytes Relative: 19.8 % — ABNORMAL HIGH (ref 3.0–12.0)
Neutro Abs: 2.1 10*3/uL (ref 1.4–7.7)
Neutrophils Relative %: 47.4 % (ref 43.0–77.0)
Platelets: 215 10*3/uL (ref 150.0–400.0)
RBC: 4.14 Mil/uL (ref 3.87–5.11)
RDW: 13 % (ref 11.5–15.5)
WBC: 4.3 10*3/uL (ref 4.0–10.5)

## 2021-11-20 LAB — VITAMIN D 25 HYDROXY (VIT D DEFICIENCY, FRACTURES): VITD: 38.85 ng/mL (ref 30.00–100.00)

## 2021-11-20 LAB — TSH: TSH: 0.93 u[IU]/mL (ref 0.35–5.50)

## 2021-11-20 MED ORDER — LISINOPRIL 20 MG PO TABS
20.0000 mg | ORAL_TABLET | Freq: Every day | ORAL | 3 refills | Status: DC
Start: 2021-11-20 — End: 2022-12-09

## 2021-11-20 NOTE — Progress Notes (Signed)
New patient to est care.   ? ?Hypertension:    ?Using medication without problems or lightheadedness: yes ?Chest pain with exertion:no ?Edema:no ?Short of breath: no ?Labs pending.  ?No ACE.  No h/o angioedema.  ?On lisinopril . ? ?H/o vit D def, recheck pending.   ? ?Mammogram due in May of this year, d/w pt.   ? ?Parkinson's.  Per neuro.  Usually taking sinemet 4-6 tabs per day.  It helped tremor.  First noted tremor at ~age 56 or 53.   ? ?History of psoriasis, on treatment per outside clinic.  I asked her to verify her current medications. ? ?Husband designated if patient were incapacitated. ? ?Family history colon cancer.  Discussed GI referral. ? ?Meds, vitals, and allergies reviewed.  ? ?ROS: Per HPI unless specifically indicated in ROS section  ? ?GEN: nad, alert and oriented ?HEENT:ncat ?NECK: supple w/o LA ?CV: rrr.  ?PULM: ctab, no inc wob ?ABD: soft, +bs ?EXT: no edema ?SKIN: Psoriatic changes noted on the L foot, dorsal.   ?No tremor on exam today. ? ?32 minutes were devoted to patient care in this encounter (this includes time spent reviewing the patient's file/history, interviewing and examining the patient, counseling/reviewing plan with patient).  ? ?

## 2021-11-20 NOTE — Telephone Encounter (Signed)
Pt called back with name of med her and PCP discussed earlier at appt. Pt is using Skyrizi 150 mg/mL and she gets one shot every 3 months. ? ?Pt did forget she has a biometric screening form that she will need PCP to fill out. She is going to complete her part and fax it over to [place] she just wanted PCP to be aware  ?

## 2021-11-20 NOTE — Patient Instructions (Addendum)
Go to the lab on the way out.   If you have mychart we'll likely use that to update you.    ?I'll check back on your old records.  ?Take care.  Glad to see you. ?We'll call about seeing [facility].   ?Update me about your psoriasis dose and medicine.  ?

## 2021-11-21 MED ORDER — SKYRIZI 150 MG/ML ~~LOC~~ SOSY: 150.0000 mg | PREFILLED_SYRINGE | SUBCUTANEOUS | Status: AC

## 2021-11-21 NOTE — Telephone Encounter (Signed)
Noted. Thanks.  I'll work on the forms.  Med list updated.  ?

## 2021-11-23 ENCOUNTER — Encounter: Payer: Self-pay | Admitting: Family Medicine

## 2021-11-23 DIAGNOSIS — Z7189 Other specified counseling: Secondary | ICD-10-CM | POA: Insufficient documentation

## 2021-11-23 DIAGNOSIS — Z1211 Encounter for screening for malignant neoplasm of colon: Secondary | ICD-10-CM | POA: Insufficient documentation

## 2021-11-23 NOTE — Assessment & Plan Note (Signed)
Husband designated if patient were incapacitated. 

## 2021-11-23 NOTE — Assessment & Plan Note (Signed)
Pathophysiology discussed with patient.  No tremor noted at time of exam.  Routine cautions given to patient.  Continue Sinemet for now.  Per neurology. ?

## 2021-11-23 NOTE — Assessment & Plan Note (Signed)
Per outside clinic.  She can update me about her medications. ?

## 2021-11-23 NOTE — Assessment & Plan Note (Signed)
FH colon cancer.  Refer to GI.  She agrees.  ?

## 2021-11-23 NOTE — Assessment & Plan Note (Signed)
History of vitamin D deficiency.  See notes on labs. ?

## 2021-11-23 NOTE — Assessment & Plan Note (Signed)
Continue work on diet and exercise.  Continue lisinopril.  See notes on labs. ?

## 2021-11-26 ENCOUNTER — Other Ambulatory Visit: Payer: Self-pay | Admitting: Family Medicine

## 2021-11-26 DIAGNOSIS — E538 Deficiency of other specified B group vitamins: Secondary | ICD-10-CM

## 2021-11-26 DIAGNOSIS — R7989 Other specified abnormal findings of blood chemistry: Secondary | ICD-10-CM

## 2021-12-04 ENCOUNTER — Encounter: Payer: Self-pay | Admitting: *Deleted

## 2021-12-06 ENCOUNTER — Encounter: Payer: Self-pay | Admitting: Family Medicine

## 2021-12-06 ENCOUNTER — Other Ambulatory Visit (INDEPENDENT_AMBULATORY_CARE_PROVIDER_SITE_OTHER): Payer: Managed Care, Other (non HMO)

## 2021-12-06 DIAGNOSIS — E538 Deficiency of other specified B group vitamins: Secondary | ICD-10-CM

## 2021-12-06 DIAGNOSIS — R7989 Other specified abnormal findings of blood chemistry: Secondary | ICD-10-CM | POA: Diagnosis not present

## 2021-12-06 LAB — CBC WITH DIFFERENTIAL/PLATELET
Basophils Absolute: 0.1 10*3/uL (ref 0.0–0.1)
Basophils Relative: 1.2 % (ref 0.0–3.0)
Eosinophils Absolute: 0.2 10*3/uL (ref 0.0–0.7)
Eosinophils Relative: 3.7 % (ref 0.0–5.0)
HCT: 37.4 % (ref 36.0–46.0)
Hemoglobin: 12.8 g/dL (ref 12.0–15.0)
Lymphocytes Relative: 34.7 % (ref 12.0–46.0)
Lymphs Abs: 2.1 10*3/uL (ref 0.7–4.0)
MCHC: 34.1 g/dL (ref 30.0–36.0)
MCV: 92.5 fl (ref 78.0–100.0)
Monocytes Absolute: 0.5 10*3/uL (ref 0.1–1.0)
Monocytes Relative: 8.7 % (ref 3.0–12.0)
Neutro Abs: 3.1 10*3/uL (ref 1.4–7.7)
Neutrophils Relative %: 51.7 % (ref 43.0–77.0)
Platelets: 288 10*3/uL (ref 150.0–400.0)
RBC: 4.05 Mil/uL (ref 3.87–5.11)
RDW: 13 % (ref 11.5–15.5)
WBC: 6.1 10*3/uL (ref 4.0–10.5)

## 2021-12-06 LAB — VITAMIN B12: Vitamin B-12: 563 pg/mL (ref 211–911)

## 2021-12-07 LAB — PATHOLOGIST SMEAR REVIEW

## 2022-06-19 LAB — HM COLONOSCOPY

## 2022-06-29 ENCOUNTER — Telehealth: Payer: Self-pay | Admitting: Family Medicine

## 2022-06-29 DIAGNOSIS — G20A1 Parkinson's disease without dyskinesia, without mention of fluctuations: Secondary | ICD-10-CM

## 2022-06-29 NOTE — Telephone Encounter (Signed)
Patient called in and stated she is trying to get in with Dr. Nelida Gores at [facility]. She stated they need a referral to be sent over in order to see her. They phone number is [phone]. She would like a call when this is done. Thank you!

## 2022-06-29 NOTE — Telephone Encounter (Signed)
Patient notified

## 2022-06-29 NOTE — Telephone Encounter (Signed)
I put in the referral.  Thanks.  

## 2022-06-29 NOTE — Addendum Note (Signed)
Addended by: Joaquim Nam on: 06/29/2022 02:49 PM   Modules accepted: Orders

## 2022-07-08 ENCOUNTER — Other Ambulatory Visit: Payer: Self-pay | Admitting: Family Medicine

## 2022-08-28 NOTE — Progress Notes (Unsigned)
    Sacha Topor T. Tirza Senteno, MD, [place] at [facility] [place], [address]  Phone: [phone]  FAX: [place] - 57 y.o. female  MRN [id number]  Date of Birth: 16-Jun-1966  Date: 08/29/2022  PCP: Tonia Ghent, MD  Referral: Tonia Ghent, MD  No chief complaint on file.  Subjective:   Kathy Thompson is a 57 y.o. very pleasant female patient with There is no height or weight on file to calculate BMI. who presents with the following:  Pleasant patient presents with ongoing acute back pain.    Review of Systems is noted in the HPI, as appropriate  Objective:   There were no vitals taken for this visit.  GEN: No acute distress; alert,appropriate. PULM: Breathing comfortably in no respiratory distress PSYCH: Normally interactive.   Laboratory and Imaging Data:  Assessment and Plan:   ***

## 2022-08-29 ENCOUNTER — Encounter: Payer: Self-pay | Admitting: Family Medicine

## 2022-08-29 ENCOUNTER — Ambulatory Visit: Payer: Managed Care, Other (non HMO) | Admitting: Family Medicine

## 2022-08-29 VITALS — BP 110/70 | HR 83 | Temp 98.7°F | Ht 66.15 in | Wt 169.2 lb

## 2022-08-29 DIAGNOSIS — M545 Low back pain, unspecified: Secondary | ICD-10-CM

## 2022-08-29 MED ORDER — DICLOFENAC SODIUM 75 MG PO TBEC: 75.0000 mg | DELAYED_RELEASE_TABLET | Freq: Two times a day (BID) | ORAL | 3 refills | Status: DC

## 2022-08-29 NOTE — Patient Instructions (Signed)
Dr. Felecia Shelling [facility] Neurology Dr. Carles Collet [facility] Neurology Dr. Mervyn Skeeters [facility]

## 2022-12-07 ENCOUNTER — Other Ambulatory Visit: Payer: Self-pay | Admitting: Family Medicine

## 2022-12-07 NOTE — Telephone Encounter (Signed)
Refill request for LISINOPRIL 20 MG TAB   LOV - 11/20/21 Next OV - 12/17/22 Last refill - 11/20/21 #90/3

## 2022-12-09 NOTE — Telephone Encounter (Signed)
Sent. Thanks.   

## 2022-12-11 ENCOUNTER — Other Ambulatory Visit: Payer: Self-pay | Admitting: Family Medicine

## 2022-12-11 ENCOUNTER — Other Ambulatory Visit (INDEPENDENT_AMBULATORY_CARE_PROVIDER_SITE_OTHER): Payer: Managed Care, Other (non HMO)

## 2022-12-11 DIAGNOSIS — E559 Vitamin D deficiency, unspecified: Secondary | ICD-10-CM

## 2022-12-11 DIAGNOSIS — I10 Essential (primary) hypertension: Secondary | ICD-10-CM

## 2022-12-11 DIAGNOSIS — E538 Deficiency of other specified B group vitamins: Secondary | ICD-10-CM | POA: Diagnosis not present

## 2022-12-11 LAB — COMPREHENSIVE METABOLIC PANEL
ALT: 14 U/L (ref 0–35)
AST: 15 U/L (ref 0–37)
Albumin: 4.6 g/dL (ref 3.5–5.2)
Alkaline Phosphatase: 89 U/L (ref 39–117)
BUN: 23 mg/dL (ref 6–23)
CO2: 26 mEq/L (ref 19–32)
Calcium: 9.4 mg/dL (ref 8.4–10.5)
Chloride: 103 mEq/L (ref 96–112)
Creatinine, Ser: 0.59 mg/dL (ref 0.40–1.20)
GFR: 99.88 mL/min (ref >60.00)
Glucose, Bld: 91 mg/dL (ref 70–99)
Potassium: 3.9 mEq/L (ref 3.5–5.1)
Sodium: 139 mEq/L (ref 135–145)
Total Bilirubin: 0.5 mg/dL (ref 0.2–1.2)
Total Protein: 7 g/dL (ref 6.0–8.3)

## 2022-12-11 LAB — TSH: TSH: 2.38 u[IU]/mL (ref 0.35–5.50)

## 2022-12-11 LAB — CBC WITH DIFFERENTIAL/PLATELET
Basophils Absolute: 0 10*3/uL (ref 0.0–0.1)
Basophils Relative: 0.9 % (ref 0.0–3.0)
Eosinophils Absolute: 0.3 10*3/uL (ref 0.0–0.7)
Eosinophils Relative: 5.7 % — ABNORMAL HIGH (ref 0.0–5.0)
HCT: 41.6 % (ref 36.0–46.0)
Hemoglobin: 14.5 g/dL (ref 12.0–15.0)
Lymphocytes Relative: 38.8 % (ref 12.0–46.0)
Lymphs Abs: 2.1 10*3/uL (ref 0.7–4.0)
MCHC: 34.8 g/dL (ref 30.0–36.0)
MCV: 92.9 fl (ref 78.0–100.0)
Monocytes Absolute: 0.5 10*3/uL (ref 0.1–1.0)
Monocytes Relative: 9.7 % (ref 3.0–12.0)
Neutro Abs: 2.4 10*3/uL (ref 1.4–7.7)
Neutrophils Relative %: 44.9 % (ref 43.0–77.0)
Platelets: 276 10*3/uL (ref 150.0–400.0)
RBC: 4.48 Mil/uL (ref 3.87–5.11)
RDW: 12.9 % (ref 11.5–15.5)
WBC: 5.4 10*3/uL (ref 4.0–10.5)

## 2022-12-11 LAB — LIPID PANEL
Cholesterol: 178 mg/dL (ref 0–200)
HDL: 66.7 mg/dL (ref >39.00)
LDL Cholesterol: 102 mg/dL — ABNORMAL HIGH (ref 0–99)
NonHDL: 111.3
Total CHOL/HDL Ratio: 3
Triglycerides: 49 mg/dL (ref 0.0–149.0)
VLDL: 9.8 mg/dL (ref 0.0–40.0)

## 2022-12-11 LAB — VITAMIN B12: Vitamin B-12: 500 pg/mL (ref 211–911)

## 2022-12-11 LAB — VITAMIN D 25 HYDROXY (VIT D DEFICIENCY, FRACTURES): VITD: 25.28 ng/mL — ABNORMAL LOW (ref 30.00–100.00)

## 2022-12-17 ENCOUNTER — Ambulatory Visit (INDEPENDENT_AMBULATORY_CARE_PROVIDER_SITE_OTHER): Payer: Managed Care, Other (non HMO) | Admitting: Family Medicine

## 2022-12-17 ENCOUNTER — Encounter: Payer: Self-pay | Admitting: Family Medicine

## 2022-12-17 VITALS — BP 108/70 | HR 81 | Temp 97.9°F | Ht 66.15 in | Wt 163.0 lb

## 2022-12-17 DIAGNOSIS — E559 Vitamin D deficiency, unspecified: Secondary | ICD-10-CM

## 2022-12-17 DIAGNOSIS — Z0000 Encounter for general adult medical examination without abnormal findings: Secondary | ICD-10-CM | POA: Diagnosis not present

## 2022-12-17 DIAGNOSIS — Z7189 Other specified counseling: Secondary | ICD-10-CM

## 2022-12-17 DIAGNOSIS — L409 Psoriasis, unspecified: Secondary | ICD-10-CM

## 2022-12-17 DIAGNOSIS — I10 Essential (primary) hypertension: Secondary | ICD-10-CM

## 2022-12-17 DIAGNOSIS — G20A1 Parkinson's disease without dyskinesia, without mention of fluctuations: Secondary | ICD-10-CM

## 2022-12-17 MED ORDER — VITAMIN D3 50 MCG (2000 UT) PO CAPS
2000.0000 [IU] | ORAL_CAPSULE | Freq: Every day | ORAL | Status: AC
Start: 2022-12-17 — End: ?

## 2022-12-17 NOTE — Patient Instructions (Addendum)
Please ask the front for a record release from [facility] re: pap and mammogram and clinic notes.    Please ask the front for a record release from dermatology.    If you are getting lightheaded then let me know.    Take care.  Glad to see you.  Add on vitamin D 2000 units per day.  Recheck vitamin D level at a nonfasting lab visit in about 3 months.

## 2022-12-17 NOTE — Progress Notes (Signed)
CPE- See plan.  Routine anticipatory guidance given to patient.  See health maintenance.  The possibility exists that previously documented standard health maintenance information may have been brought forward from a previous encounter into this note.  If needed, that same information has been updated to reflect the current situation based on today's encounter.    Tetanus d/w pt.   Flu 2023 Covid prev done.  PNA not due.   Shingles d/w pt.   Colonoscopy 2023-- [facility].  Check on follow up date.  Pap per gyn  Mammogram per gyn.   DXA not due  Living will d/w pt.  Husband designated if patient were incapacitated.   Diet and exercise d/w pt.    Waist 37.5", biometric form done at [facility].    Parkinsons. She has neuro follow up pending. On sinemet TID at baseline. Variable symptom severity from day to day and during the day.  She has RLS sx at night.    Hypertension:    Using medication without problems or lightheadedness: yes Chest pain with exertion:no Edema:no Short of breath:no Labs d/w pt.    On Skyrizi per outside clinic, dermatology.    B12 wnl.  D/w pt.    Vit D low.    PMH and SH reviewed  Meds, vitals, and allergies reviewed.   ROS: Per HPI.  Unless specifically indicated otherwise in HPI, the patient denies:  General: fever. Eyes: acute vision changes ENT: sore throat Cardiovascular: chest pain Respiratory: SOB GI: vomiting GU: dysuria Musculoskeletal: acute back pain Derm: acute rash Neuro: acute motor dysfunction Psych: worsening mood Endocrine: polydipsia Heme: bleeding Allergy: hayfever  GEN: nad, alert and oriented HEENT: mucous membranes moist NECK: supple w/o LA CV: rrr. PULM: ctab, no inc wob ABD: soft, +bs EXT: no edema SKIN: no acute rash

## 2022-12-18 ENCOUNTER — Telehealth: Payer: Self-pay | Admitting: Family Medicine

## 2022-12-18 DIAGNOSIS — Z0000 Encounter for general adult medical examination without abnormal findings: Secondary | ICD-10-CM | POA: Insufficient documentation

## 2022-12-18 NOTE — Assessment & Plan Note (Signed)
Living will d/w pt.  Husband designated if patient were incapacitated.  

## 2022-12-18 NOTE — Assessment & Plan Note (Signed)
On Skyrizi per outside clinic, dermatology.

## 2022-12-18 NOTE — Assessment & Plan Note (Signed)
She has neuro follow up pending. On sinemet TID at baseline. Variable symptom severity from day to day and during the day.  She has RLS sx at night.  I will await neuro consult note.  No change in meds in the meantime.

## 2022-12-18 NOTE — Telephone Encounter (Signed)
Please check with [facility] about colonoscopy follow up date.  Please let me know.  Thanks.

## 2022-12-18 NOTE — Assessment & Plan Note (Signed)
Continue work on diet and exercise.  Continue lisinopril.  If lightheaded, update clinic.  Labs d/w pt. She agrees with plan.

## 2022-12-18 NOTE — Assessment & Plan Note (Signed)
Tetanus d/w pt.   Flu 2023 Covid prev done.  PNA not due.   Shingles d/w pt.   Colonoscopy 2023-- [facility].   Pap per gyn  Mammogram per gyn.   DXA not due  Living will d/w pt.  Husband designated if patient were incapacitated.   Diet and exercise d/w pt.

## 2022-12-18 NOTE — Telephone Encounter (Signed)
Per report in patients chart; patient needed 10 year f/u. Last one was done on 10/28/15 and looks like new referral was sent to [facility] on 12/04/21.

## 2022-12-18 NOTE — Assessment & Plan Note (Signed)
Add on vitamin D 2000 units per day.  Recheck vitamin D level at a nonfasting lab visit in about 3 months.

## 2022-12-19 NOTE — Telephone Encounter (Signed)
Spoke with [facility] and patient is to repeat colonoscopy in 5 yrs.

## 2022-12-19 NOTE — Telephone Encounter (Signed)
She had another colonoscopy done on 06/19/2022.  We have that report but I do not have a letter from [place] stating her planned follow-up time.  I did not see that letter in the chart.  Without that, please check with [facility] to see when they want to see her back so we can update the chart.  Thanks.

## 2022-12-19 NOTE — Telephone Encounter (Signed)
Noted. Thanks.

## 2023-03-19 ENCOUNTER — Other Ambulatory Visit: Payer: Managed Care, Other (non HMO)

## 2023-03-21 ENCOUNTER — Other Ambulatory Visit (INDEPENDENT_AMBULATORY_CARE_PROVIDER_SITE_OTHER): Payer: Managed Care, Other (non HMO)

## 2023-03-21 DIAGNOSIS — E559 Vitamin D deficiency, unspecified: Secondary | ICD-10-CM

## 2023-03-21 LAB — VITAMIN D 25 HYDROXY (VIT D DEFICIENCY, FRACTURES): VITD: 29.57 ng/mL — ABNORMAL LOW (ref 30.00–100.00)

## 2023-03-24 ENCOUNTER — Other Ambulatory Visit: Payer: Self-pay | Admitting: Family Medicine

## 2023-03-24 DIAGNOSIS — E559 Vitamin D deficiency, unspecified: Secondary | ICD-10-CM

## 2023-06-24 ENCOUNTER — Other Ambulatory Visit (INDEPENDENT_AMBULATORY_CARE_PROVIDER_SITE_OTHER): Payer: Managed Care, Other (non HMO)

## 2023-06-24 DIAGNOSIS — E559 Vitamin D deficiency, unspecified: Secondary | ICD-10-CM | POA: Diagnosis not present

## 2023-06-24 LAB — VITAMIN D 25 HYDROXY (VIT D DEFICIENCY, FRACTURES): VITD: 29.95 ng/mL — ABNORMAL LOW (ref 30.00–100.00)

## 2023-06-28 ENCOUNTER — Telehealth: Payer: Self-pay

## 2023-06-28 NOTE — Telephone Encounter (Signed)
Patient called in returning a call she received. Relayed message below to patient.

## 2023-06-28 NOTE — Telephone Encounter (Signed)
LMTCB

## 2023-06-28 NOTE — Telephone Encounter (Signed)
-----   Message from Crawford Givens sent at 06/26/2023  9:50 PM EST ----- Please update patient. Vitamin D nearly normal. Would continue vitamin D 2000 units per day. Level should normalize in the near future and wouldn't need recheck lab. Thanks.

## 2023-07-22 ENCOUNTER — Other Ambulatory Visit: Payer: Self-pay | Admitting: Family Medicine

## 2024-02-11 ENCOUNTER — Telehealth: Payer: Self-pay | Admitting: Family Medicine

## 2024-02-11 NOTE — Telephone Encounter (Signed)
 Copied from CRM [id number]. Topic: Appointments - Scheduling Inquiry for Clinic >> Feb 11, 2024 12:24 PM Burnard DEL wrote: Reason for CRM: Patients son just got out of the [employer] and was previously told by another patients of [place] that is sin the family  that it would be okay for him to be his patient.The whole  Ly family sees [place].Would like to know if he would take som on as a patient of his as well?

## 2024-02-12 NOTE — Telephone Encounter (Signed)
 Please set him up when possible.  Thanks.

## 2024-02-12 NOTE — Telephone Encounter (Signed)
 Left message for patient advising that Dr. Cleatus will take her son on as a new patient.

## 2024-02-20 ENCOUNTER — Other Ambulatory Visit: Payer: Self-pay | Admitting: Family Medicine

## 2024-02-20 ENCOUNTER — Other Ambulatory Visit

## 2024-02-20 DIAGNOSIS — I10 Essential (primary) hypertension: Secondary | ICD-10-CM

## 2024-02-20 DIAGNOSIS — E559 Vitamin D deficiency, unspecified: Secondary | ICD-10-CM | POA: Diagnosis not present

## 2024-02-20 DIAGNOSIS — E538 Deficiency of other specified B group vitamins: Secondary | ICD-10-CM | POA: Diagnosis not present

## 2024-02-20 LAB — COMPREHENSIVE METABOLIC PANEL WITH GFR
ALT: 6 U/L (ref 0–35)
AST: 23 U/L (ref 0–37)
Albumin: 4.5 g/dL (ref 3.5–5.2)
Alkaline Phosphatase: 78 U/L (ref 39–117)
BUN: 21 mg/dL (ref 6–23)
CO2: 31 meq/L (ref 19–32)
Calcium: 9.2 mg/dL (ref 8.4–10.5)
Chloride: 104 meq/L (ref 96–112)
Creatinine, Ser: 0.54 mg/dL (ref 0.40–1.20)
GFR: 101.18 mL/min (ref >60.00)
Glucose, Bld: 92 mg/dL (ref 70–99)
Potassium: 4.1 meq/L (ref 3.5–5.1)
Sodium: 140 meq/L (ref 135–145)
Total Bilirubin: 0.5 mg/dL (ref 0.2–1.2)
Total Protein: 6.8 g/dL (ref 6.0–8.3)

## 2024-02-20 LAB — CBC WITH DIFFERENTIAL/PLATELET
Basophils Absolute: 0.1 10*3/uL (ref 0.0–0.1)
Basophils Relative: 1.1 % (ref 0.0–3.0)
Eosinophils Absolute: 0.3 10*3/uL (ref 0.0–0.7)
Eosinophils Relative: 3.8 % (ref 0.0–5.0)
HCT: 40.2 % (ref 36.0–46.0)
Hemoglobin: 13.7 g/dL (ref 12.0–15.0)
Lymphocytes Relative: 32.8 % (ref 12.0–46.0)
Lymphs Abs: 2.2 10*3/uL (ref 0.7–4.0)
MCHC: 34 g/dL (ref 30.0–36.0)
MCV: 92.4 fl (ref 78.0–100.0)
Monocytes Absolute: 0.5 10*3/uL (ref 0.1–1.0)
Monocytes Relative: 8.2 % (ref 3.0–12.0)
Neutro Abs: 3.6 10*3/uL (ref 1.4–7.7)
Neutrophils Relative %: 54.1 % (ref 43.0–77.0)
Platelets: 251 10*3/uL (ref 150.0–400.0)
RBC: 4.35 Mil/uL (ref 3.87–5.11)
RDW: 13.1 % (ref 11.5–15.5)
WBC: 6.6 10*3/uL (ref 4.0–10.5)

## 2024-02-20 LAB — LIPID PANEL
Cholesterol: 189 mg/dL (ref 0–200)
HDL: 76.6 mg/dL (ref >39.00)
LDL Cholesterol: 105 mg/dL — ABNORMAL HIGH (ref 0–99)
NonHDL: 112.12
Total CHOL/HDL Ratio: 2
Triglycerides: 36 mg/dL (ref 0.0–149.0)
VLDL: 7.2 mg/dL (ref 0.0–40.0)

## 2024-02-20 LAB — TSH: TSH: 1.3 u[IU]/mL (ref 0.35–5.50)

## 2024-02-20 LAB — VITAMIN B12: Vitamin B-12: 441 pg/mL (ref 211–911)

## 2024-02-20 LAB — VITAMIN D 25 HYDROXY (VIT D DEFICIENCY, FRACTURES): VITD: 26.88 ng/mL — ABNORMAL LOW (ref 30.00–100.00)

## 2024-02-23 ENCOUNTER — Ambulatory Visit: Payer: Self-pay | Admitting: Family Medicine

## 2024-02-27 ENCOUNTER — Encounter: Payer: Self-pay | Admitting: Family Medicine

## 2024-02-27 ENCOUNTER — Ambulatory Visit (INDEPENDENT_AMBULATORY_CARE_PROVIDER_SITE_OTHER): Admitting: Family Medicine

## 2024-02-27 VITALS — BP 114/64 | HR 78 | Temp 98.6°F | Ht 66.5 in | Wt 154.0 lb

## 2024-02-27 DIAGNOSIS — Z0000 Encounter for general adult medical examination without abnormal findings: Secondary | ICD-10-CM

## 2024-02-27 DIAGNOSIS — I10 Essential (primary) hypertension: Secondary | ICD-10-CM

## 2024-02-27 DIAGNOSIS — Z7189 Other specified counseling: Secondary | ICD-10-CM

## 2024-02-27 DIAGNOSIS — E559 Vitamin D deficiency, unspecified: Secondary | ICD-10-CM

## 2024-02-27 DIAGNOSIS — G20A1 Parkinson's disease without dyskinesia, without mention of fluctuations: Secondary | ICD-10-CM

## 2024-02-27 DIAGNOSIS — L409 Psoriasis, unspecified: Secondary | ICD-10-CM

## 2024-02-27 MED ORDER — LISINOPRIL 20 MG PO TABS: 20.0000 mg | ORAL_TABLET | Freq: Every day | ORAL | 3 refills | Status: AC

## 2024-02-27 NOTE — Progress Notes (Signed)
 CPE- See plan.  Routine anticipatory guidance given to patient.  See health maintenance.  The possibility exists that previously documented standard health maintenance information may have been brought forward from a previous encounter into this note.  If needed, that same information has been updated to reflect the current situation based on today's encounter.     Tetanus d/w pt.   Flu encouraged.   Covid prev done.  PNA not due.   Shingles d/w pt.   Colonoscopy 2023-- [facility].   Pap per gyn, f/u pending 2025.   Mammogram per gyn.   DXA not due  Living will d/w pt.  Husband designated if patient were incapacitated.   Diet and exercise d/w pt.     Waist 34, she can drop off biometric form for work, if needed.     Parkinsons. She has neuro follow up pending. On sinemet TID at baseline.  She has RLS sx at night, occasional.     Hypertension:               Using medication without problems or lightheadedness: yes Chest pain with exertion:no Edema:no Short of breath:no Labs d/w pt.     On Skyrizi  per outside clinic, dermatology.     B12 wnl.  D/w pt.     Vit D low.  Discussed replacement and recheck.  She had been off replacement.     PMH and SH reviewed   Meds, vitals, and allergies reviewed.    ROS: Per HPI.  Unless specifically indicated otherwise in HPI, the patient denies:   General: fever. Eyes: acute vision changes ENT: sore throat Cardiovascular: chest pain Respiratory: SOB GI: vomiting GU: dysuria Musculoskeletal: acute back pain Derm: acute rash Neuro: acute motor dysfunction Psych: worsening mood Endocrine: polydipsia Heme: bleeding Allergy: hayfever   GEN: nad, alert and oriented HEENT: ncat NECK: supple w/o LA CV: rrr. PULM: ctab, no inc wob ABD: soft, +bs EXT: no edema SKIN: no acute rash

## 2024-02-27 NOTE — Patient Instructions (Addendum)
 Please drop off the biometric form for work, if needed.   Check with your insurance to see if they will cover the shingles shot. Tdap when possible.   I would get a flu shot each fall.    Start back on the vitamin D .   Take care.  Glad to see you.

## 2024-03-01 NOTE — Assessment & Plan Note (Signed)
 Per neuro

## 2024-03-01 NOTE — Assessment & Plan Note (Signed)
On Skyrizi per outside clinic, dermatology.

## 2024-03-01 NOTE — Assessment & Plan Note (Signed)
 Vit D low.  Discussed replacement and recheck at next CPE.  She had been off replacement.

## 2024-03-01 NOTE — Assessment & Plan Note (Signed)
 Labs d/w pt.   Continue diet/exercise/lisinopril .

## 2024-03-01 NOTE — Assessment & Plan Note (Signed)
 Tetanus d/w pt.   Flu encouraged.   Covid prev done.  PNA not due.   Shingles d/w pt.   Colonoscopy 2023-- [facility].   Pap per gyn, f/u pending 2025.   Mammogram per gyn.   DXA not due  Living will d/w pt.  Husband designated if patient were incapacitated.   Diet and exercise d/w pt.     Waist 34, she can drop off biometric form for work, if needed.

## 2024-03-01 NOTE — Assessment & Plan Note (Signed)
Living will d/w pt.  Husband designated if patient were incapacitated.
